# Patient Record
Sex: Female | Born: 1945 | Race: White | Hispanic: No
Health system: Midwestern US, Community
[De-identification: ages and names within clinical notes are randomized; demographics above are authoritative.]

## PROBLEM LIST (undated history)

## (undated) DIAGNOSIS — I10 Essential (primary) hypertension: Secondary | ICD-10-CM

## (undated) DIAGNOSIS — E785 Hyperlipidemia, unspecified: Secondary | ICD-10-CM

## (undated) DIAGNOSIS — I639 Cerebral infarction, unspecified: Secondary | ICD-10-CM

## (undated) HISTORY — DX: Cerebral infarction, unspecified: I63.9

## (undated) HISTORY — DX: Hyperlipidemia, unspecified: E78.5

---

## 1975-03-29 HISTORY — PX: ABDOMINAL HYSTERECTOMY: SHX81

## 1988-03-28 HISTORY — PX: BILATERAL SALPINGOOPHORECTOMY: SHX1223

## 1997-11-04 ENCOUNTER — Ambulatory Visit (HOSPITAL_COMMUNITY): Admission: RE | Admit: 1997-11-04 | Discharge: 1997-11-04 | Payer: Self-pay | Admitting: Internal Medicine

## 1997-12-10 ENCOUNTER — Ambulatory Visit (HOSPITAL_COMMUNITY): Admission: RE | Admit: 1997-12-10 | Discharge: 1997-12-10 | Payer: Self-pay | Admitting: Cardiology

## 1998-01-13 ENCOUNTER — Ambulatory Visit (HOSPITAL_COMMUNITY): Admission: RE | Admit: 1998-01-13 | Discharge: 1998-01-13 | Payer: Self-pay | Admitting: Internal Medicine

## 1998-01-13 ENCOUNTER — Encounter: Payer: Self-pay | Admitting: Internal Medicine

## 1998-04-09 ENCOUNTER — Encounter: Payer: Self-pay | Admitting: Obstetrics and Gynecology

## 1998-04-09 ENCOUNTER — Ambulatory Visit (HOSPITAL_COMMUNITY): Admission: RE | Admit: 1998-04-09 | Discharge: 1998-04-09 | Payer: Self-pay | Admitting: Obstetrics and Gynecology

## 1998-07-09 ENCOUNTER — Ambulatory Visit (HOSPITAL_COMMUNITY): Admission: RE | Admit: 1998-07-09 | Discharge: 1998-07-09 | Payer: Self-pay | Admitting: Obstetrics and Gynecology

## 1998-07-09 ENCOUNTER — Encounter: Payer: Self-pay | Admitting: Obstetrics and Gynecology

## 1998-08-11 ENCOUNTER — Ambulatory Visit (HOSPITAL_COMMUNITY): Admission: RE | Admit: 1998-08-11 | Discharge: 1998-08-11 | Payer: Self-pay | Admitting: Neurosurgery

## 1998-08-11 ENCOUNTER — Encounter: Payer: Self-pay | Admitting: Neurosurgery

## 1998-12-19 ENCOUNTER — Ambulatory Visit (HOSPITAL_COMMUNITY): Admission: RE | Admit: 1998-12-19 | Discharge: 1998-12-19 | Payer: Self-pay | Admitting: Internal Medicine

## 1998-12-19 ENCOUNTER — Encounter: Payer: Self-pay | Admitting: Internal Medicine

## 1999-01-04 ENCOUNTER — Ambulatory Visit (HOSPITAL_COMMUNITY): Admission: RE | Admit: 1999-01-04 | Discharge: 1999-01-04 | Payer: Self-pay | Admitting: Internal Medicine

## 1999-01-04 ENCOUNTER — Encounter: Payer: Self-pay | Admitting: Obstetrics and Gynecology

## 1999-08-23 ENCOUNTER — Encounter: Payer: Self-pay | Admitting: Family Medicine

## 1999-08-23 ENCOUNTER — Encounter: Admission: RE | Admit: 1999-08-23 | Discharge: 1999-08-23 | Payer: Self-pay | Admitting: Family Medicine

## 1999-09-13 ENCOUNTER — Encounter (INDEPENDENT_AMBULATORY_CARE_PROVIDER_SITE_OTHER): Payer: Self-pay | Admitting: *Deleted

## 1999-09-13 ENCOUNTER — Ambulatory Visit (HOSPITAL_COMMUNITY): Admission: RE | Admit: 1999-09-13 | Discharge: 1999-09-13 | Payer: Self-pay | Admitting: Internal Medicine

## 1999-09-14 ENCOUNTER — Ambulatory Visit (HOSPITAL_COMMUNITY): Admission: RE | Admit: 1999-09-14 | Discharge: 1999-09-14 | Payer: Self-pay | Admitting: Internal Medicine

## 1999-09-14 ENCOUNTER — Encounter: Payer: Self-pay | Admitting: Internal Medicine

## 1999-10-27 ENCOUNTER — Emergency Department (HOSPITAL_COMMUNITY): Admission: EM | Admit: 1999-10-27 | Discharge: 1999-10-27 | Payer: Self-pay | Admitting: Emergency Medicine

## 1999-11-17 ENCOUNTER — Encounter (INDEPENDENT_AMBULATORY_CARE_PROVIDER_SITE_OTHER): Payer: Self-pay | Admitting: Specialist

## 1999-11-17 ENCOUNTER — Inpatient Hospital Stay (HOSPITAL_COMMUNITY): Admission: RE | Admit: 1999-11-17 | Discharge: 1999-11-26 | Payer: Self-pay | Admitting: Surgery

## 1999-11-27 ENCOUNTER — Encounter: Payer: Self-pay | Admitting: General Surgery

## 1999-11-27 ENCOUNTER — Encounter: Payer: Self-pay | Admitting: Emergency Medicine

## 1999-11-27 ENCOUNTER — Inpatient Hospital Stay (HOSPITAL_COMMUNITY): Admission: EM | Admit: 1999-11-27 | Discharge: 1999-12-08 | Payer: Self-pay | Admitting: Emergency Medicine

## 1999-11-27 HISTORY — PX: APPENDECTOMY: SHX54

## 1999-11-29 ENCOUNTER — Encounter: Payer: Self-pay | Admitting: General Surgery

## 1999-12-01 ENCOUNTER — Encounter: Payer: Self-pay | Admitting: Surgery

## 1999-12-03 ENCOUNTER — Encounter: Payer: Self-pay | Admitting: Surgery

## 2000-08-18 ENCOUNTER — Inpatient Hospital Stay (HOSPITAL_COMMUNITY): Admission: EM | Admit: 2000-08-18 | Discharge: 2000-08-21 | Payer: Self-pay | Admitting: Internal Medicine

## 2000-08-18 ENCOUNTER — Encounter: Payer: Self-pay | Admitting: Internal Medicine

## 2004-01-07 ENCOUNTER — Inpatient Hospital Stay (HOSPITAL_COMMUNITY): Admission: EM | Admit: 2004-01-07 | Discharge: 2004-01-10 | Payer: Self-pay

## 2004-01-08 ENCOUNTER — Encounter (INDEPENDENT_AMBULATORY_CARE_PROVIDER_SITE_OTHER): Payer: Self-pay | Admitting: *Deleted

## 2004-01-23 ENCOUNTER — Encounter: Admission: RE | Admit: 2004-01-23 | Discharge: 2004-04-22 | Payer: Self-pay | Admitting: Pediatrics

## 2004-05-04 ENCOUNTER — Emergency Department: Payer: Self-pay | Admitting: Emergency Medicine

## 2004-05-16 ENCOUNTER — Emergency Department: Payer: Self-pay | Admitting: Emergency Medicine

## 2004-05-19 ENCOUNTER — Emergency Department: Payer: Self-pay | Admitting: Internal Medicine

## 2004-08-18 ENCOUNTER — Emergency Department (HOSPITAL_COMMUNITY): Admission: EM | Admit: 2004-08-18 | Discharge: 2004-08-18 | Payer: Self-pay | Admitting: Emergency Medicine

## 2005-03-29 ENCOUNTER — Ambulatory Visit (HOSPITAL_COMMUNITY): Admission: RE | Admit: 2005-03-29 | Discharge: 2005-03-29 | Payer: Self-pay | Admitting: Internal Medicine

## 2005-04-25 ENCOUNTER — Inpatient Hospital Stay (HOSPITAL_COMMUNITY): Admission: EM | Admit: 2005-04-25 | Discharge: 2005-05-04 | Payer: Self-pay | Admitting: Emergency Medicine

## 2005-04-25 ENCOUNTER — Ambulatory Visit: Payer: Self-pay | Admitting: Cardiology

## 2005-04-26 ENCOUNTER — Encounter: Payer: Self-pay | Admitting: Cardiology

## 2005-05-12 ENCOUNTER — Ambulatory Visit: Payer: Self-pay | Admitting: Cardiology

## 2005-05-19 ENCOUNTER — Ambulatory Visit: Payer: Self-pay | Admitting: Cardiology

## 2005-05-23 ENCOUNTER — Ambulatory Visit: Payer: Self-pay

## 2005-05-31 ENCOUNTER — Ambulatory Visit: Payer: Self-pay | Admitting: Internal Medicine

## 2005-07-21 ENCOUNTER — Ambulatory Visit (HOSPITAL_COMMUNITY): Admission: RE | Admit: 2005-07-21 | Discharge: 2005-07-22 | Payer: Self-pay | Admitting: Otolaryngology

## 2005-07-27 ENCOUNTER — Encounter: Payer: Self-pay | Admitting: Internal Medicine

## 2005-09-05 ENCOUNTER — Ambulatory Visit: Payer: Self-pay | Admitting: Internal Medicine

## 2005-12-13 ENCOUNTER — Ambulatory Visit: Payer: Self-pay | Admitting: Internal Medicine

## 2006-06-01 ENCOUNTER — Ambulatory Visit: Payer: Self-pay

## 2006-09-01 ENCOUNTER — Ambulatory Visit: Payer: Self-pay | Admitting: Internal Medicine

## 2006-09-19 ENCOUNTER — Ambulatory Visit: Payer: Self-pay | Admitting: Internal Medicine

## 2006-09-19 LAB — CONVERTED CEMR LAB
BUN: 23 mg/dL (ref 6–23)
Basophils Absolute: 0 10*3/uL (ref 0.0–0.1)
Calcium: 9.8 mg/dL (ref 8.4–10.5)
Creatinine, Ser: 1 mg/dL (ref 0.4–1.2)
Eosinophils Relative: 5.1 % — ABNORMAL HIGH (ref 0.0–5.0)
Glucose, Bld: 137 mg/dL — ABNORMAL HIGH (ref 70–99)
HCT: 33.8 % — ABNORMAL LOW (ref 36.0–46.0)
Hemoglobin: 11.9 g/dL — ABNORMAL LOW (ref 12.0–15.0)
INR: 0.9 (ref 0.9–2.0)
Neutrophils Relative %: 52.4 % (ref 43.0–77.0)
Prothrombin Time: 11.7 s (ref 10.0–14.0)
RBC: 3.81 M/uL — ABNORMAL LOW (ref 3.87–5.11)
Sodium: 139 meq/L (ref 135–145)
WBC: 4.4 10*3/uL — ABNORMAL LOW (ref 4.5–10.5)
aPTT: 23.3 s — ABNORMAL LOW (ref 26.5–36.5)

## 2006-09-22 ENCOUNTER — Ambulatory Visit: Payer: Self-pay | Admitting: Internal Medicine

## 2006-09-22 ENCOUNTER — Ambulatory Visit (HOSPITAL_COMMUNITY): Admission: RE | Admit: 2006-09-22 | Discharge: 2006-09-22 | Payer: Self-pay | Admitting: Internal Medicine

## 2006-10-03 ENCOUNTER — Ambulatory Visit: Payer: Self-pay

## 2006-11-08 ENCOUNTER — Other Ambulatory Visit: Admission: RE | Admit: 2006-11-08 | Discharge: 2006-11-08 | Payer: Self-pay | Admitting: Obstetrics and Gynecology

## 2007-01-09 ENCOUNTER — Ambulatory Visit: Payer: Self-pay | Admitting: Internal Medicine

## 2010-06-17 ENCOUNTER — Other Ambulatory Visit: Payer: Self-pay | Admitting: Internal Medicine

## 2010-06-17 DIAGNOSIS — G4459 Other complicated headache syndrome: Secondary | ICD-10-CM

## 2010-06-17 DIAGNOSIS — G459 Transient cerebral ischemic attack, unspecified: Secondary | ICD-10-CM

## 2010-06-21 ENCOUNTER — Other Ambulatory Visit: Payer: Self-pay | Admitting: Internal Medicine

## 2010-06-21 ENCOUNTER — Ambulatory Visit
Admission: RE | Admit: 2010-06-21 | Discharge: 2010-06-21 | Disposition: A | Discharge status: Home / Self Care | Payer: Medicare Other | Source: Ambulatory Visit | Attending: Internal Medicine | Admitting: Internal Medicine

## 2010-06-21 DIAGNOSIS — G4459 Other complicated headache syndrome: Secondary | ICD-10-CM

## 2010-06-21 DIAGNOSIS — Z823 Family history of stroke: Secondary | ICD-10-CM

## 2010-06-21 DIAGNOSIS — I671 Cerebral aneurysm, nonruptured: Secondary | ICD-10-CM

## 2010-06-21 DIAGNOSIS — I729 Aneurysm of unspecified site: Secondary | ICD-10-CM

## 2010-06-21 MED ORDER — GADOBENATE DIMEGLUMINE 529 MG/ML IV SOLN
11.0000 mL | Freq: Once | INTRAVENOUS | Status: AC | PRN
  Administered 2010-06-21: 11 mL via INTRAVENOUS

## 2010-08-10 NOTE — Discharge Summary (Signed)
NAMENETTYE, FLEGAL                    ACCOUNT NO.:  [id number]   MEDICAL RECORD NO.:  [id number]          PATIENT TYPE:  OIB   LOCATION:  2899                         FACILITY:  [facility]   PHYSICIAN:  Doylene Canning. Ladona Ridgel, MD    DATE OF BIRTH:  04-20-1945   DATE OF ADMISSION:  09/22/2006  DATE OF DISCHARGE:  09/22/2006                               DISCHARGE SUMMARY   ALLERGIES:  1. MORPHINE SULFATE.  2. CODEINE.  3. SULFA.  4. ULTRAM.  5. STATINS.   TIME FOR THIS DICTATION:  Greater than 25 minutes.   FINAL DIAGNOSIS:  Syncope of unclear etiology.   SECONDARY DIAGNOSES:  1. Loop recorder implanted, which is now at end of life and needs      explantation.  2. Hypertension.  3. One burst of nonsustained supraventricular tachycardia recorded on      loop recorder.  4. Leg cramps  5. Chronic hypokalemia.   PROCEDURE:  On September 22, 2006. explantation of [employer] loop recorder.  The patient tolerated the procedure well.  Discharging the same day.   She goes home on her regular medications, which are:  1. Klor-Con 20 mEq 2 tablets in the morning, 2 tablets in the evening.  2. Benicar/HCT 40/25 daily.  Dr. Ladona Ridgel recommended that she change to      Benicar 40 mg daily without the hydrochlorothiazide.  This does not      seem to be done yet.  3. Metoprolol 25 mg daily.  4. Norvasc 5 mg daily.  5. Enteric-coated aspirin 81 mg daily.  6. Lexapro 10 mg daily.  7. Prilosec 25 mg daily.   She has followup for a stress test October 02, 2006.  She is asked to keep  that, and also we are asking that her incision be checked at that time  as well.   LABORATORY STUDIES PERTINENT TO THIS ADMISSION:  Taken September 19, 2006,  white cells 4.4, hemoglobin 11.9, hematocrit 33.8 platelets of 300.  Pro  time is 11.7, INR 0.9.  Sodium 139, potassium is 3, chloride 105,  bicarbonate 26, glucose 137, BUN is 23, creatinine 1.  Her Klor-Con was  increased to 40 mg twice daily with finding of potassium of  3.      Maple Mirza, PA      Doylene Canning. Ladona Ridgel, MD  Electronically Signed    GM/MEDQ  D:  09/22/2006  T:  09/23/2006  Job:  [id number]   cc:   Candyce Churn, M.D.

## 2010-08-10 NOTE — Assessment & Plan Note (Signed)
[facility]                         ELECTROPHYSIOLOGY OFFICE NOTE   NAME:[name], Patty SCHERGER                           MRN:          [id number]  DATE:09/01/2006                            DOB:          04/13/1945    [name] returns today for followup.  She is a very pleasant middle-aged  woman with unexplained syncope who underwent insertion of implantable  loop recorder several years ago.  Her device has now reached end-of-life  and is no longer communicative with the interrogator.  She also notes  some chest discomfort which is not related to exertion.   EXAM:  Today, she is a pleasant, well-appearing, middle-aged woman in no  acute distress.  The blood pressure was 173/80, the pulse was 77 and  regular, respirations were 18, the weight was 126 pounds.  NECK:  No jugular venous distention.  LUNGS:  Clear bilaterally to auscultation.  No wheezes, rales or rhonchi  were present.  CARDIOVASCULAR EXAM:  A regular rate and rhythm with normal S1, S2.  EXTREMITIES:  No edema.   Interrogation of her loop recorder demonstrates no availability in  interrogation secondary to the device being at end of life.   IMPRESSION:  1. Syncope of unexplained etiology.  2. Status post loop recorder, now at end of life.  3. Atypical chest pain.   DISCUSSION:  I have recommended that we remove the patient's loop  recorder and undergo exercise treadmill testing to better evaluate her  chest pain for ischemic etiology.  This will be scheduled in the next  couple of weeks.     Doylene Canning. Ladona Ridgel, MD  Electronically Signed    GWT/MedQ  DD: 09/01/2006  DT: 09/01/2006  Job #: [id number]   cc:   Candyce Churn, M.D.

## 2010-08-10 NOTE — Op Note (Signed)
[name], [name]                    ACCOUNT NO.:  [id number]   MEDICAL RECORD NO.:  [id number]          PATIENT TYPE:  OIB   LOCATION:  2899                         FACILITY:  [facility]   PHYSICIAN:  Doylene Canning. Ladona Ridgel, MD    DATE OF BIRTH:  [birth year]   DATE OF PROCEDURE:  09/22/2006  DATE OF DISCHARGE:                               OPERATIVE REPORT   PROCEDURE PERFORMED:  Removal of a previously implantable loop recorder.   INDICATIONS:  Implantable loop recorder was inserted for syncope,  unexplained, and is now at [facility].   INTRODUCTION:  The patient is a very pleasant middle aged woman with a  history of unexplained syncope who has preserved LV function and no  obstructive coronary disease.  She is now referred for removal of her  implantable loop recorder as it has now come to [facility].   PROCEDURE:  After informed consent was obtained, the patient was taken  to the diagnostic catheterization lab in a fasting state.  After the  usual preparation and draping, intravenous fentanyl and midazolam was  given for sedation.  30 mL of lidocaine was infiltrated in the left  infraclavicular region.  A 3 cm incision was carried out over this  region.  Electrocautery was utilized to dissect down to the implantable  loop recorder.  It was subsequently removed with gentle traction.  The  sutures were also removed.  The pocket was then irrigated with  kanamycin.  Electrocautery was utilized to assure hemostasis and the  incision was closed with a layer 2-0 Vicryl followed by a layer of 4-0  Vicryl.  Benzoin was painted on the skin, Steri-Strips were applied, a  pressure dressing was placed, and the patient was returned to her room  in satisfactory condition.   COMPLICATIONS:  There were no immediate procedure complications.   RESULTS:  This demonstrate successful removal of an implantable loop  recorder in a patient with a prior history of unexplained syncope.      Doylene Canning. Ladona Ridgel, MD  Electronically Signed     GWT/MEDQ  D:  09/22/2006  T:  09/23/2006  Job:  [id number]   cc:   Candyce Churn, M.D.

## 2010-08-10 NOTE — Assessment & Plan Note (Signed)
[facility]                         ELECTROPHYSIOLOGY OFFICE NOTE   NAME:Vanvalkenburgh, CARLEA BADOUR                           MRN:          [id number]  DATE:01/09/2007                            DOB:          [birth year]    Ms. Recktenwald returns today for followup.  She is a very pleasant middle-aged  woman with a history of unexplained syncope, who underwent loop recorder  insertion several years ago.  She returns today for followup.  She also  continued to have atypical chest pain.  She underwent stress perfusion  imaging several months ago, which was negative for ischemia.  She  returns today for followup. She notes that her blood pressure has been  reasonably well controlled.   MEDICATIONS:  1. Aspirin 81 mg a day.  2. Lexapro.  3. Benicar/hydrochlorothiazide.  4. Potassium.  5. Prilosec 20 mg a day.  6. Amlodipine 5 mg a day.  7. Metoprolol 25 mg daily.  8. Multivitamins.   PHYSICAL EXAMINATION:  She is a pleasant well-appearing middle-aged  woman in no acute distress.  Blood pressure was 130/76, the pulse was 61 and regular, the  respirations were 18 and the weight was 128 pounds.  NECK:  Revealed no jugulovenous distention.  LUNGS:  Clear bilaterally to auscultation; no wheezes, rales or rhonchi  were present.  CARDIOVASCULAR:  Exam revealed a regular rate and rhythm with a normal  S1 and S2.  There were no murmurs, rubs, or gallops.  EXTREMITIES:  Demonstrated no cyanosis, clubbing or edema.  Pulses were  2+ and symmetric.   EKG:  Demonstrated sinus rhythm with nonspecific ST-T-wave abnormality,  unchanged significant from her previous ECG.   IMPRESSION:  1. Remote history of syncope, status post loop recorder with no      arrhythmias.  2. Atypical chest pain with low-risk stress Myoview study several      months ago.  3. Hypertension, well-controlled.   DISCUSSION:  Ms. Dolloff's symptoms are fairly quiescent at the present  time.  I have asked that  she continue a period of watchful waiting and I  will plan to see the patient back in the office on a p.r.n. basis.     Doylene Canning. Ladona Ridgel, MD  Electronically Signed    GWT/MedQ  DD: 01/09/2007  DT: 01/10/2007  Job #: [id number]   cc:   Candyce Churn, M.D.

## 2010-08-13 NOTE — H&P (Signed)
[facility]  Patient:    [name], [name]                           MRN: [id number] Adm. Date:  [id number] Attending:  Andre Lefort CC:         Sandria Bales. Ezzard Standing, M.D.   History and Physical  IDENTIFICATION AND CHIEF COMPLAINT:  The patient is a 65 year old female with the recurrence of small-bowel obstruction.  HISTORY OF PRESENT ILLNESS:  The patient was recently released from the hospital after an exploratory laparotomy and a lysis of adhesions by Dr. Ovidio Kin.  This procedure was done because of abdominal pain and discomfort and difficulty eating related to previous operations that the patient had had for hysterectomy and a partial hysterectomy.  Her past medical history very unremarkable and had been doing well with the exception of this discomfort once she got home after this procedure.  She called me earlier in the day with multiple episodes of nausea and vomiting which she felt was related to pain medication administration.  I prescribed her some Phenergan suppositories and she, subsequently, had those filled and used them with no relief in her nausea or vomiting, and she presented to the emergency room with severe nausea and vomiting.  PAST MEDICAL HISTORY:  Pretty unremarkable, except for some GERD.  MEDICATIONS:  Pravachol and atenolol for hypertension.  PAST SURGICAL HISTORY:  None, outside of what was mentioned previously including the hysterectomy and partial hysterectomy, and she has had a gallbladder and cholecystectomy and the enterolysis done recently.  REVIEW OF SYSTEMS:  The patient had a bowel movement which was not normal the day of admission, just a smear of soft stool.  She has had no fever or chills. Urinalysis is normal.  PHYSICAL EXAMINATION:  VITAL SIGNS:  Afebrile, with blood pressure 140/80, other vital signs stable.  HEENT:  She is normocephalic, atraumatic, and nonicteric.  NECK:  Supple.  CHEST:   Clear.  CARDIAC:  Regular rhythm and rate with no murmurs, gallops, rubs, or heaves.  ABDOMEN:  Distended, especially in the lower portion, with hypoactive bowel sounds.  She has mild tenderness, no rebound or guarding.  RECTAL:  Unremarkable.  PELVIC:  Deferred.  LABORATORY DATA:  Slightly elevated white count at 12,000, with a mild left shift.  Hematocrit is 37-38%.  Three-way abdomen demonstrates positive gas in the colon and almost a closed loop of small bowel near the left upper to midportion of the abdomen.  A CT scan was not done.  IMPRESSION:  Partial small-bowel obstruction.  PLAN:  Pass an NG tube for decompression, IV hydration, no antibiotics. DD:  11/28/99 TD:  11/28/99 Job: [id number]

## 2010-08-13 NOTE — Assessment & Plan Note (Signed)
[facility]                           ELECTROPHYSIOLOGY OFFICE NOTE   NAME:Decelles, [name]                           MRN:          [id number]  DATE:12/13/2005                            DOB:          10/04/1945    HISTORY:  [name] returns today for follow up.  She is a very pleasant  middle-aged woman with a history of hypertension and syncope who returns for  follow up as noted.  She is status post implantable loop recorder several  months ago.  She has palpitations, which are rare, but denies chest pain.  She does complain of leg cramps, which occur at nighttime and awaken her  from sleep.  She has a history of hypokalemia in the past and is on  potassium supplements; however, she is on, because of her hypertension,  Benicar/hydrochlorothiazide; I wonder if this is related to her leg cramps.   PHYSICAL EXAMINATION:  GENERAL APPEARANCE:  On exam she is a pleasant, well-  appearing woman in no distress.  VITAL SIGNS:  Blood pressure today is 173/90.  The pulse is  62 and regular.  Respirations are 18.  The weight is 128 pounds.  NECK:  The neck reveals no jugular venous distention.  LUNGS:  The lungs are clear bilaterally to auscultation.  HEART:  Cardiovascular exam reveals a regular rate and rhythm with normal S1  and S2.  EXTREMITIES:  The extremities demonstrate no edema.   IMPLANTABLE LOOP RECORDER INTERROGATION DATA:  Interrogation of her  implantable loop recorder demonstrates one burst of nonsustained SVT lasting  for approximately four seconds.  There were no bradycardic episodes.  There  were no other tachycardiac episodes.   IMPRESSION:  1. Syncope (unexplained).  2. Status post loop recorder implantation.  3. Hypertension.  4. Leg cramps.   DISCUSSION:  Today I have asked [name] to switch from  Benicar/hydrochlorothiazide to plain Benicar to see if this helps with her  leg cramps from her hypokalemia.  She will continue on her  potassium  supplements.   We will plan to see her back in the office in six months for interrogation  of her loop recorder or sooner should she have additional syncope or other  significant problems.                                   Doylene Canning. Ladona Ridgel, MD   GWT/MedQ  DD:  12/13/2005 DT:  12/14/2005 Job #:  [id number]   cc:   Candyce Churn, M.D.

## 2010-08-13 NOTE — Op Note (Signed)
. [facility]  Patient:    KRISTIANNA, SAPERSTEIN                           MRN: [id number] Proc. Date: 11/17/99 Adm. Date:  [id number] Attending:  Andre Lefort CC:         Hedwig Morton. Juanda Chance, M.D. [facility]  Arta Silence, M.D. - [facility].   Operative Report  DATE OF BIRTH:  06/28/1945  PREOPERATIVE DIAGNOSES:  Chronic right lower quadrant abdominal pain with symptoms of a partial small-bowel obstruction or chronic small-bowel obstruction.  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  PROCEDURE:  Laparoscopic converted to open abdominal exploration and enterolysis of adhesions, repair of single small bowel enterotomy, appendectomy.  SURGEON:  Ovidio Kin, M.D.  FIRST ASSISTANT:  Catalina Lunger, M.D.  ANESTHESIA:  General endotracheal anesthesia.  ESTIMATED BLOOD LOSS:  450 cc.  DRAINS LEFT IN:  None.  INDICATIONS FOR PROCEDURE:  Ms. Fretz is a 65 year old white female who has been plagued with chronic lower pain particularly in the right lower quadrant since a bilateral oophorectomy performed in [place] in 1998.  She has seen Lina Sar, and is under extensive workup including a CAT scan, small bowel follow through series, colonoscopy which were basically diagnostic except for some maybe mild dilatation of her small bowel.  Discussion was carried out about the options for treatment including continue observation, versus laparoscopy, versus open surgery.  The patient really says the pain is intolerable and needs "something done".  Our plan is to laparoscope the patient if possible, do as much enterolysis as possible, and if I can not, and if this does not work than she will need open surgery.  OPERATIVE NOTE:  The patient completed mechanical and antibiotic bowel prep the day before.  She was given 3 grams of cefotetan at the initiation of the procedure.  Had PS  stockings in place and an NG tube was placed.  An infraumbilical incision was made with sharp dissection and carried down into the abdominal cavity.  A 0 degree laparoscope was passed through a 10 mm Hasson trocar and secured with a 0 Vicryl suture.  The 10 mm scope was not working, so I got a 30 degree scope to look into the abdomen and used a 5 mm scope.  I put two additional trocars which were 5 mm ports in both upper quadrants.  I was able to visualize some midline adhesions.  She actually had some fairly dense adhesions in the right lower quadrant which looked almost inflammatory and on taking these down they bled easily.  She had some nodularity to them and may explain, at least in part, some of her symptoms of pain and discomfort.  In trying to resect down the adhesions with the laparoscope I created a a small enterotomy in the small bowel.  It was clear that we could not repair this laparoscopically and she would be best off if we just went in and opened it. I therefore, took out my trocars, made an open lower midline abdominal incision into the abdominal cavity.  I spent a good hour to an hour and half freeing up adhesions which went from the ______ and involved her distal 1/2 of her small bowel, stuck both to the sigmoid colon on the left side, the pelvis, and the vaginal cuff.  I closed the enterotomy with a 3-0 silk suture.  I took down the adhesions, a couple of little serosal tears, I oversewed with 3-0 silk suture.  I finally got the entire loop of bowel up. It looked viable.  There was no narrowing at any one point that was noted.  I ______ sigmoid of ______ just along the left lateral side wall.  I did have her appendix remaining since it was still stuck in and I wanted to take this out at this time since I thought it would be in her best interest. The body of the mesentery repaired with 2-0 silk suture, the base of the appendix with 0 chromic and 3 silk inverting sutures  to close the appendiceal orifice.  I then irrigated the abdomen with 3 liters of saline, replaced the omentum in a proper position on the anterior abdominal wall to hopefully protect it from any other further adhesions or problems.  I tried to place omentum down the pelvis to keep it from sticking down there and closed the abdomen with 2 pieces of running, 1 PDS suture, with a couple of interrupted PDS sutures in the middle of the incision for security.  Sponge and needle count were correct at the end of the case and the patient was transferred to the recovery room in good condition.  Sponge and needle correct correct.  The only pathology would be the appendix which was removed. DD:  11/17/99 TD:  11/18/99 Job: [id number]

## 2010-08-13 NOTE — Op Note (Signed)
[place]  Patient:    [name], [name]                           MRN: [id number] Proc. Date: 09/13/99 Adm. Date:  [id number] Disc. Date: [id number] Attending:  Mervin Hack CC:         Laurita Quint, M.D.                           Operative Report  PROCEDURE:  Colonoscopy.  INDICATIONS:  This 65 year old white female has had chronic right lower quadrant abdominal pain with recent exacerbation.  The pain has been so incapacitating that she has not been able to walk.  The CT scan of the abdomen and pelvis was unremarkable, as well as pelvic ultrasound.  She has a history of pelvic surgery several years ago which resulted in postoperative complications, and subsequent admission for suspected small bowel obstruction. At that time her terminal ileum showed narrowing consistent with Crohns disease.  Repeat small bowel follow-through later on showed resolution of the radiographic abnormality.  Because of this abnormal x-ray and the recurrence of the abdominal pain, the patient is now undergoing a repeat colonoscopy to assess her right colon as well as terminal ileum.  This is done to rule out any inflammatory changes that could be causing her abdominal pain postoperatively.  ENDOSCOPE:  Fujinon single-channel video endoscope.  SEDATION:  Versed 10 mg IV, Demerol 100 mg IV.  FINDINGS:  Fujinon single-channel video endoscope was passed under direct vision through the rectum into the sigmoid colon.  The patient was monitored by pulse oximetry; oxygen saturations were 87-98% on four liters of nasal oxygen.  The anal canal was found to be unremarkable.  The sigmoid colon showed multiple diverticuli, but normal-sized lumen of the sigmoid colon with essentially normal mucosal folds.  The descending colon, splenic flexure and transverse colon were traversed without difficulty.  There were no extra turns or redundancy of the colon.  Hepatic flexure  and ascending colon was also normal.  Cecal valve was reached without difficulty and showed appendiceal opening.  Cecal valve was clean and free of stool.  The ileocecal valve appeared normal.  The terminal ileum was entered without difficulty and was examined over the length of about 15 cm.  The mucosa appeared normal, there were no accessed ulcers, no abnormal narrowing or scar tissue.  Random biopsies were taken from the terminal ileum.  The colonoscope was then slowly retracted.  The colon decompressed.  The patient tolerated the procedure well.  IMPRESSION: 1. Mild diverticulosis of the left colon. 2. Status post biopsies of the terminal ileum; no evidence of Crohns disease.  PLAN:  The patients abdominal pain remains unexplained.  At this point we do not have any objective abnormalities to explain her abdominal pain.  Because of the pelvic surgery, which had started with pain, and the possibility that she may have adhesions that are not showing on any of radiographic studies, I feel that surgical consultation may be appropriate at the time to consider laparoscopic examination of her right lower quadrant. DD:  09/13/99 TD:  09/15/99 Job: [id number]
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
## 2010-08-13 NOTE — Consult Note (Signed)
NAME:  [name], [name]                    ACCOUNT NO.:  [id number]   MEDICAL RECORD NO.:  [id number]          PATIENT TYPE:  INP   LOCATION:  3020                         FACILITY:  [facility]   PHYSICIAN:  Dyke Brackett, M.D.    DATE OF BIRTH:  [birth year]   DATE OF CONSULTATION:  DATE OF DISCHARGE:                                   CONSULTATION   ADDENDUM:   CLINICAL IMPRESSION:  1.  Multiple contusions to the right upper extremity.  2.  Possible bony Bankart evulsion injury from the inferior glenoid rim.  3.  Possible rotator cuff tear.   RECOMMENDATIONS:  At this time this may just represent an old injury or  possibly that of an loose body. At this time will consider just the use of a  sling for comfort. Will follow her back up in Dr. Candise Bowens office next week  for re-evaluation. At that time we can decide whether MRI scanning is  indicated. If she has any problems in the meantime we will see her at an  earlier date.   Thank you very much for this interesting consultation.      Oris Drone Santiago Bumpers, P.A.-C.      Dyke Brackett, M.D.  Electronically Signed    BDP/MEDQ  D:  04/26/2005  T:  04/26/2005  Job:  [id number]

## 2010-08-13 NOTE — Discharge Summary (Signed)
[name], [name]                    ACCOUNT NO.:  [id number]   MEDICAL RECORD NO.:  [id number]          PATIENT TYPE:  INP   LOCATION:  3019                         FACILITY:  [facility]   PHYSICIAN:  Deanna Artis. Hickling, M.D.DATE OF BIRTH:  [birth year]   DATE OF ADMISSION:  01/07/2004  DATE OF DISCHARGE:  01/10/2004                                 DISCHARGE SUMMARY   FINAL DIAGNOSES:  1.  Right brain acute cerebrovascular accident, nonhemorrhagic, thrombotic,      434.01.  2.  Hypertension, 404.10.  3.  Dyslipidemia.   HISTORY OF PRESENT ILLNESS:  The patient is a 65 year old woman who presents  with left-sided weakness that has waxed and waned.  She was noted to have a  CT scan that showed remote lacunar infarctions at the left basal ganglia and  the right anterior limb of the internal capsule. MRI scan, however, showed a  2 cm acute infarction of the right corona radiata extending to the lateral  aspect of the putamen.  MRA was negative except for atherosclerotic changes  in the siphon.  EKG showed a normal sinus rhythm.  A 2-D echocardiogram,  ejection fraction of 55-65% with no left ventricular regional wall motion  abnormalities, slightly increased left ventricular thickness, abnormal left  ventricular relaxation, trivial aortic and mild mitral regurgitation.  Carotid Dopplers were negative for any hemodynamically significant lesion in  the carotids, and the patient had no evidence of retrograde flow through the  vertebrals.  Speech therapy found no abnormalities.  PT and OT felt that the  patient could benefit from outpatient therapy due to clumsiness, and the  need for further strengthening and improvement of coordination.   PHYSICAL EXAMINATION:  VITAL SIGNS:  On examination today, blood pressure  172/87, resting pulse 115, respirations 18, temperature 97.8.  O2  saturations 92%.  GENERAL:  The patient is awake and alert.  LUNGS:  Clear.  HEART:  No murmurs.  PULSES:   Normal.  ABDOMEN:  Soft, nontender.  Bowel sounds normal.  EXTREMITIES:  No edema.  NEUROLOGIC EXAMINATION:  Awake, alert, no dysphagia.  Cranial nerves: Round,  reactive pupils, normal fundi.  Visual fields full. Symmetric facial  strength. Midline tongue.  Motor examination shows a left hemiparesis that  is mild.  She has good proximal strength. She is very clumsy, fine motor  movements in her hand.  She has some drift with pronation and very slow  movement to bring her finger to her nose.  Some of this appears to be  embellished, but it is difficult to know how much.  The left leg appears to  be normal as does the right side.  Reflexes show a left reflex predominance.  No other focal abnormalities are seen.   DISCHARGE CONDITION:  The patient is discharged in improved condition.   DISCHARGE MEDICATIONS:  1.  Metoprolol 25 mg once daily.  2.  Zocor 40 mg one at 6 p.m.  3.  Hydrochlorothiazide 25 mg one in the morning.  4.  Plavix 75 mg, one in the morning.  5.  Low-fat,  low-salt diet.   DISCHARGE INSTRUCTIONS:  1.  She is not to drive.  2.  She needs to have PT and OT for three weeks as an outpatient.  This will      be set up.  I have contacted the care manager concerning this.       [facility]H/MEDQ  D:  01/10/2004  T:  01/10/2004  Job:  [id number]   cc:   Pramod P. Pearlean Brownie, MD  Fax: [phone]
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
## 2010-08-13 NOTE — Consult Note (Signed)
[name], Page                    ACCOUNT NO.:  [id number]   MEDICAL RECORD NO.:  [id number]          PATIENT TYPE:  EMS   LOCATION:  ED                           FACILITY:  [facility]   PHYSICIAN:  Anselm Pancoast. Weatherly, M.D.DATE OF BIRTH:  [birth year]   DATE OF CONSULTATION:  08/18/2004  DATE OF DISCHARGE:                                   CONSULTATION   CHIEF COMPLAINT:  Left lower abdominal pain.   HISTORY:  [name] is a 65 year old female referred today by Dr. Aida Puffer of [place], [place] for evaluation of left lower abdominal  pain.  Patient gives the following history:  She is on hydrochlorothiazide  and Altace for high blood pressure.  I think about February, 2002, underwent  a laparotomy by Dr. Ezzard Standing for, the patient says, lysis of adhesions, but on  more questioning, it sounded to me that this was possibly diverticulosis,  even though she said no definite bowel resection was performed.  The patient  has not seen Dr. Ezzard Standing but called today, and the nurses recommended that  she see her regular physician, and she saw Dr. Clarene Duke, who she had not seen  previously for this.  He found her definitely tender in the left lower  quadrant, and she said she has not had a bowel movement for approximately  three days, even though she was passing gas and is not distended and has not  been vomiting.  He called and asked that I see her for evaluation, and I  recommended that she come to the emergency room.  She said that her pain was  most intense on Friday and Saturday over the past weekend.  It was kind of  sudden onset of left lower quadrant when it first presented.  Weight is  about 121 pounds.  Has not lost a significant amount of weight.  Works  basically two jobs.  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  She stated that she had a possible problem from her appendix  years ago, but  I do not have any definite details confirming that.  She says  normally her bowels have not been a problem, as far as having to take  chronic laxatives and all, even though she does get some cramping and  bloating in the lower abdomen.   ALLERGIES:  She says she cannot take NARCOTICS.  She is allergic to SULFA.  She is allergic to IVP DYE.   CURRENT MEDICATIONS:  She is on Prilosec.  She is on Altace, Plavix,  hydrochlorothiazide, and metoprolol tartrate.   PHYSICAL EXAMINATION:  VITAL SIGNS:  When she presented to the emergency  room at approximately 5:30, her pulse was 69, respirations 20, blood  pressure 177/90.  She weighed 121 pounds.  GENERAL:  She is a pleasant female who appears her stated age in no acute  distress.  She is kind of laughing and joking, even though Dr. Clarene Duke  described her as  having pretty significant localized left lower quadrant  when she was seen in his office.  She was not given any pain medication  there.  LUNGS:  Good breath sounds bilaterally.  ABDOMEN:  She does have active bowel sounds now.  She is mildly tender in  the left lower quadrant, not really rebound-type tenderness.  Clinically, I  think this would be more likely diverticulitis, mild episode.   A plain abdominal film was first obtained, which was unremarkable.   In her laboratory studies, her white count is 5800.  Her hemoglobin is 12.9  gm% with a hematocrit of 37.3.   I then obtained a CAT scan with oral contrast.  She states that she is  allergic to IVP dye.  The CT shows changes of diverticulosis and possibly a  little short segment of diverticulitis in the left lower quadrant.  There is  contrast going to the rectum at this time, and there is certainly no  evidence of any clinical obstruction.   IMPRESSION:  With the normal white count and afebrile, I think we can treat  her with oral antibiotics, and I would put her on Augmentin 875 b.i.d. and  keep her on kind of a liquid diet  for the next 48 hours and then gradually  advance her diet.  She is going to need a follow-up examination of her colon  in a few weeks, and we will let her see Dr. Ezzard Standing, who has operated on her  previously for this followup, unless she would prefer to see me.  As far as  whether a colonoscopy or a full-column barium enema, if she has no blood in  her stool, which I am not able to detect any at this time, I would usually  get a full-column barium enema, and this clinically appears to be the first  episode of diverticulitis that she has described.  I will try to get the old  records or what Dr. Ezzard Standing had found at the time, about four years ago, when  he operated on her.      WJW/MEDQ  D:  08/18/2004  T:  08/18/2004  Job:  [id number]   cc:   Dr. Aida Puffer  [facility]  [address] [place] [address]

## 2010-08-13 NOTE — Discharge Summary (Signed)
[name]  Patient:    [name], [name]                           MRN: [id number] Adm. Date:  [id number] Disc. Date: [id number] Attending:  Andre Lefort CC:         Laurita Quint, M.D., [facility]  Hedwig Morton. Juanda Chance, M.D. [facility]  Daniel L. Clarke-Pearson, M.D.   Discharge Summary  DATE OF BIRTH:  Mar 28, 1946  DISCHARGE DIAGNOSES: 1. 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 2. 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 3. Left ear ache. 4. Hypertension. 5. Chronic right lower quadrant abdominal pain.  OPERATIONS PERFORMED:  Laparoscopic converted to open enterolysis of adhesions with repair of enterotomy and appendectomy on November 17, 1999.  HISTORY OF PRESENT ILLNESS:  [name] is a 65 year old white female who is a patient of Dr. Laurita Quint and Dr. Lina Sar who has had chronic right lower quadrant abdominal pain.  In the past, she has undergone a ______  in approximately ______ without incident, but then she had bilateral salpingo-oophorectomy by Dr. Greggory Keen in [place] in 1998 and has had postoperative discomfort and pain basically since that time.  She was actually hospitalized in July 1998 for a partial bowel obstruction which resolved spontaneously.  She kind of had vague abdominal discomfort and problems on and off for the last two years, though in the last six months, she has noticed some increase in the discomfort she has had with pain more in the right lower quadrant.  She had undergone a CT scan in May which showed a tubular structure in the left pelvis which was unchanged over the last six months.  She underwent a colonoscopy by Dr. Lina Sar in June 2001 which showed no evidence of Crohns disease or colitis.  Then she underwent a small bowel series at [facility] on September 14, 1999, which showed some mild dilatation of  distal small bowel, no other abnormality.  She was admitted for planned exploration fluoroscopically, possibly open, to relieve possible chronic bowel obstruction secondary to adhesions.  MEDICATIONS:  Prevacid, Estradiol, atenolol, and propoxyphene or Darvocet for pain.  PAST MEDICAL HISTORY:  Otherwise noncontributory.  ADMISSION LABORATORY DATA:  Hemoglobin 13.7, hematocrit 38.1, white blood count 8300.  She had a sodium of 139, potassium 3.67, chloride 107, creatinine 1.0, glucose 118.  HOSPITAL COURSE:  She was taken to the operating room on the day of admission. She had undergone a bowel prep the day before surgery.  She underwent a laparoscopy which I then converted to an open enterolysis of adhesions.  She underwent repair of single enterotomy and also finding her appendix did not look grossly abnormal other than adhesions around it and did an appendectomy on her.  Postoperatively, she did well.  On her first postoperative day, her white blood count was 10,400, hemoglobin 11, hematocrit 33.  Sodium 137, potassium 4.3, chloride 107, glucose 164.  She has a fairly low tolerance to pain.  I left her NG tube in for a good four to five days.  She developed an ear ache secondary to NG tube.  I finally pulled the NG tube and started her on clear liquids which she has tolerated.  She is now 9 days postop.  She is having bowel movements.  She is mildly distended, though her abdominal incision is well  healed.  She still says she hurts a fair amount but is able to tolerate it with oral pain medicine.  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  Dr. Luisa Hart is requesting slides from [place] where her bilateral salpingo-oophorectomy  was obtained.  I have spoken with Dr. Serita Kyle who is going to review the patients history and chart after we get all her pathology  together.  DISCHARGE CONDITION:  She is now ready for discharge.  DIET:  Regular.  ACTIVITY:  She is to do no heavy lifting for four weeks.  FOLLOWUP:  She is to see me back in 10 days or 14 days for review of her pathology and see how she is doing.  She can shower.  I sent her home with [facility] and also said she can use some Advil or ibuprofen for pain control. DD:  11/26/99 TD:  11/27/99 Job: [id number]

## 2010-08-13 NOTE — Discharge Summary (Signed)
[place]  Patient:    [name], [name]                           MRN: [id number] Adm. Date:  [id number] Disc. Date: [id number] Attending:  Andre Lefort CC:         Hedwig Morton. Juanda Chance, M.D. [facility]  Daniel L. Clarke-Pearson, M.D.   Discharge Summary  DISCHARGE DIAGNOSES: 1. Partial small-bowel obstruction which has resolved. 2. Primary peritoneal tumor of indeterminate etiology, final pathology    pending. 3. Chronic abdominal pain. 4. Hypertension.  OPERATION PERFORMED:  None.  HISTORY OF PRESENT ILLNESS:  [name] is a 65 year old white female who underwent a recent enterolysis of adhesions by me.  She had been home only a day or two when she started having an increasing abdominal pain, nausea and vomiting and was readmitted by Jimmye Norman, M.D., for me.  Her past medical history and general history were well outlined in her last admission notes.  PAST MEDICAL HISTORY:  This is most significant for: 1. Hypertension treated with atenolol. 2. Hypercholesterolemia treated with Pravachol. 3. Gastroesophageal reflux disease.  ADMISSION PHYSICAL EXAMINATION:  VITAL SIGNS:  Blood pressure 140/80.  ABDOMEN:  Distended, especially the lower part with hypoactive bowel sounds and she had mild lower abdominal tenderness.  LABORATORY DATA:  Her white blood cell count was 12,000, hematocrit 37.  HOSPITAL COURSE: On admission she had an NG tube placed by Dr. Lindie Spruce and was placed on IV fluids.  Over the next 48 hours she seemed to get a little bit better.  I left her NG tube in and started clamping this after three days in the hospital and on the fifth day we checked her KUB which showed significant improvement in her bowel gas pattern and we removed her NG tube.  On December 03, 1999, her sodium was 134, potassium 4.0, chloride 103, CO2 20, glucose 117.  Problems with small-bowel was resolved, though she was still having a fair amount of abdominal  pain and is taking p.o.s poorly so we decided to keep her a few more days.  She is now in the hospital 11 days.  She is eating much better with less abdominal pain. She has had a bowel movement. Her abdomen is reasonably soft without any evidence of infection and she is ready for discharge.  DISCHARGE MEDICATIONS:  Resume her home medications including antihypertensives.  We will give her some Demerol 50 mg x 20 x a pain medication at home.  DIET:  Eating light.  ACTIVITY:  Her activity will really be kind of unlimited.  FOLLOW-UP:  She is to see me back in one week.  DISCHARGE CONDITION:  Good. DD:  12/08/99 TD:  12/09/99 Job: [id number]

## 2010-08-13 NOTE — Discharge Summary (Signed)
[facility]  Patient:    [name], [name]                           MRN: [id number] Adm. Date:  [id number] Disc. Date: [id number] Attending:  Tresa Garter CC:         Laurita Quint, M.D. [facility]   Discharge Summary  DISCHARGE DIAGNOSES: 1. Nausea, vomiting and diarrhea, presumed gastroenteritis. 2. Hypertension. 3. Question of Crohns disease in the past. 4. Multiple abdominal adhesions. 5. Chronic right lower quadrant abdominal pain. 6. History of renal stone. 7. Status post cholecystectomy. 8. History of total abdominal hysterectomy and bilateral    salpingo-oophorectomy. 9. Status post appendectomy.  PROCEDURES:  None.  CONSULTATIONS:  None.  HISTORY AND PHYSICAL:  Please see the note dictated on May 24 by Dr. Posey Rea.  HOSPITAL COURSE:  The patient is a pleasant 65 year old white female initially admitted after three days with nausea, vomiting, cramping abdominal pain and loose stools, mostly water, complicated by severe dehydration, weakness and found to have potassium 2.3 on admission.  She was markedly orthostatic. S he was treated with IV fluids and potassium replacement, to which she responded nicely.  Phenergan helped the nausea.  She began tolerating ice chips, and diet was advanced with gradual decrease in abdominal discomfort, as well.  By the day prior to discharge, she was asymptomatic except for a few crampy pains, and loose stool thereafter controlled with Lomotil.  She was ambulatory, eating well and afebrile.  Potassium was 3.5.  Stool cultures were negative.  CBC and BMET otherwise within normal limits.  Glucose was slightly elevated on several occasions, 154 and 122 during the hospitalization.  Stool was guaiac negative throughout.  C. difficile toxin obtained and pending.  As she was doing well on the date of discharge, she was felt to be at maximum benefit from this hospitalization, and she was discharged to  home.  DISPOSITION:  Discharged to home in good condition.  No activity restrictions. No dietary restrictions except for 3 g sodium diet as before due to her history of hypertension.  She will follow up with Dr. Hetty Ely in 1-2 weeks.  DISCHARGE MEDICATIONS:  As before admission plus Lomotil on a p.r.n. basis. This includes Atenolol 50 mg p.o. b.i.d.; Prevacid 30 mg b.i.d.; Norvasc 5 mg p.o. q.d.; Estrace 1 mg p.o. q.d. DD:  08/21/00 TD:  08/21/00 Job: [id number]
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
## 2010-08-13 NOTE — Op Note (Signed)
NAMELOUKISHA, GUNNERSON                    ACCOUNT NO.:  [id number]   MEDICAL RECORD NO.:  [id number]          PATIENT TYPE:  OIB   LOCATION:  5731                         FACILITY:  [facility]   PHYSICIAN:  Suzanna Obey, M.D.       DATE OF BIRTH:  [birth year]   DATE OF PROCEDURE:  07/21/2005  DATE OF DISCHARGE:                                 OPERATIVE REPORT   PREOPERATIVE DIAGNOSIS:  Deviated septum and turbinate hypertrophy.   POSTOPERATIVE DIAGNOSIS:  Deviated septum and turbinate hypertrophy.   SURGICAL PROCEDURES:  Septoplasty and submucous section of inferior  turbinates.   ANESTHESIA:  General.   ESTIMATED BLOOD LOSS:  Approximately 10 mL.   INDICATIONS:  This is a 65 year old who has had chronic nasal obstruction  and has failed medical therapy.  She is sick of the obstruction and is ready  to proceed with surgery.  She was informed of the risks and benefits of the  procedure including bleeding, infection, perforation, chronic crusting and  drying, septal perforation, change in the external appearance of the nose,  numbness of the teeth, risks of the anesthetic.  All questions were answered  and consent was obtained.   OPERATION:  The patient was taken to the operating room and placed in a  supine position. After adequate general endotracheal tube anesthesia, was  prepped and draped in the usual sterile manner.  Oxymetazoline pledgets were  placed in the nose bilaterally and the septum and inferior turbinates were  injected with 1% lidocaine with 1:100,000 epinephrine.  A right  hemitransfixion incision was performed raising a mucoperichondrial and  ostial flap.  The cartilage was divided about 2 cm posterior to the caudal  strut and the cartilage was removed with a Therapist, nutritional.  The opposite  flap was elevated and Laren Boom forceps were used to remove the  deviated portion of the bone and cartilage.  The hemitransfixion incision is  closed with interrupted 4-0 chromic and  a 4-0 plain gut was placed to the  septum.  The turbinates were infractured, a midline incision made with a 15  blade, mucosal flap elevated superiorly, the inferior mucosa and bone were  removed with the turbinate scissors.  The edge was cauterized with suction  cautery and both flaps were laid back down and turbinates were outfractured.  Telfa rolls soaked in bacitracin were placed in the nose bilaterally and  secured with a 3-0 nylon.  The patient was awakened and brought to the  recovery stable condition.  Counts correct.           ______________________________  Suzanna Obey, M.D.     JB/MEDQ  D:  07/21/2005  T:  07/21/2005  Job:  [id number]

## 2010-08-13 NOTE — H&P (Signed)
[facility]  Patient:    [name], [name]                           MRN: [id number] Adm. Date:  [id number] Attending:  Tresa Garter CC:         Laurita Quint, M.D. [facility]  Sandria Bales. Ezzard Standing, M.D.  Hedwig Morton. Juanda Chance, M.D. [facility]   History and Physical  DATE OF BIRTH:  07/25/1945  CHIEF COMPLAINT:  Nausea, vomiting, diarrhea.  HISTORY OF PRESENT ILLNESS:  The patient is a 65 year old white female who started fairly acute onset of nausea, vomiting, and diarrhea on Sunday.  On Saturday, she had a steak at [employer].  Three out of nine people in their party got sick with a gastrointestinal illness that lasted 24-48 hours.  This morning, she had some vomiting with bile-looking content.  She was concerned and saw Dr. Hetty Ely, who advised hospitalization.  She was admitted to [facility].  PAST MEDICAL HISTORY: 1. Hypertension. 2. Enterolysis. 3. 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 Ezzard Standing. 4. History of hysterectomy, bilateral salpingo-oophorectomy in 1998. 5. Cholecystectomy. 6. Possible Crohns disease. 7. Multiple problems with adhesions. 8. Chronic right lower quadrant abdominal pain. 9. Kidney stone in 1999.  SOCIAL HISTORY:  She is married with three children.  She does not smoke or drink.  CURRENT MEDICATIONS: 1. Atenolol 50 mg twice a day. 2. Prevacid 30 mg twice a day. 3. Norvasc 5 mg once a day. 4. Estrace 1 mg once a day.  ALLERGIES: 1. HYDROCODONE. 2. CODEINE. 3. OXYCODONE. 4. SULFA. 5. ULTRAM. 6. BIAXIN.  FAMILY HISTORY:  Father died at age 3 with CVA.  REVIEW OF SYSTEMS:  She was doing well after surgery in August 2001.  This was the first episode.  Occasional indigestion.  No chest pain or shortness of breath.  No syncope.  The rest is negative.  PHYSICAL EXAMINATION:  VITAL SIGNS:  Blood pressure supine 142/66, upright 111/71.  Afebrile.  Heart: 80.  GENERAL:  She  is in no acute distress.  She looks tired.  HEENT:  Dry oral mucosa.  NECK:  Supple.  No bruits, no thyromegaly.  LUNGS:  Clear to auscultation and percussion.  CARDIAC:  S1, S2.  No murmur, no gallop.  ABDOMEN:  Soft, slightly distended.  Bowel sounds sluggish.  Tender diffusely. No rebound symptoms.  No masses felt.  RECTAL:  Not done.  EXTREMITIES:  Lower extremity without edema.  NEUROLOGIC:  She is alert, oriented, and cooperative.  Cranial nerves II-XII normal.  Muscle strength normal.  LABORATORY DATA:  Labs not available at this point.  ASSESSMENT AND PLAN: 1. Abdominal pain, nausea, vomiting, dehydration; likely of infectious    etiology.  Obtain stool cultures.  Supportive treatment with IV fluids.  We    will keep n.p.o. for now. 2. Dehydration with orthostatic blood pressure drop.  We will hold blood    pressure medicines for one day. 3. Extensive history of adhesions with chronic abdominal pain.  She was doing    well after surgery by Dr. Ezzard Standing.  Obtain KUB. 4. Hold Estrace while in the hospital. 5. Hypertension.  Plan as above. DD:  08/18/00 TD:  08/19/00 Job: [id number]
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
## 2010-08-13 NOTE — H&P (Signed)
NAME:  [name], [name]                    ACCOUNT NO.:  [id number]   MEDICAL RECORD NO.:  [id number]          PATIENT TYPE:  EMS   LOCATION:  MAJO                         FACILITY:  [facility]   PHYSICIAN:  Michael L. Reynolds, M.D.DATE OF BIRTH:  1945/07/14   DATE OF ADMISSION:  01/07/2004  DATE OF DISCHARGE:                                HISTORY & PHYSICAL   CHIEF COMPLAINT:  Left sided weakness and slurred speech.   HISTORY OF PRESENT ILLNESS:  This is the initial [facility]  admission for this 65 year old woman with a past medical history which  includes untreated hypertension.  The patient awoke this morning feeling  funny.  She noted after waking about 5 a.m. that she had some subjective  difficulty finding and getting words out.  She also noted that she had  trouble tying her shoes and believes this was because her left hand was  clumsy and somewhat weak.  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  She was brought to the emergency room later today by her  family and here according to nursing staff and ED physician has had  fluctuating clarity of speech as well as fluctuating left upper extremity  weakness.  She was initially found to have good strength on examination by  the emergency room physician.  She had an abnormal EKG and low potassium but  had no cardiac symptoms.  She reports that since she has been receiving  potassium through her IV in her left arm, her left arm has been weaker which  also has some heaviness of the left leg which is new.  She had not had any  previous similar symptoms prior to this.   PAST MEDICAL HISTORY:  1.  Hypertension, going back about 20 years.  This has been untreated for      some time due to the fact that she lost her health insurance and has      been unable to afford her medication, although she was recently started      on  hydrochlorothiazide by an occupational medicine doctor about three      weeks ago.  2.  Mitral valve prolapse.  3.  She denies history of diabetes, stroke, or coronary artery disease.   FAMILY HISTORY:  Remarkable for multiple strokes in her father.   SOCIAL HISTORY:  She works as a Neurosurgeon.  She denies tobacco or alcohol  use.   ALLERGIES:  CODEINE.  SULFA.   MEDICATIONS:  Aspirin every day, Prilosec daily, HCTZ uncertain dose.   REVIEW OF SYSTEMS:  Negative across ten systems except as outlined in the  HPI and the emergency room and admission nursing records.   PHYSICAL EXAMINATION:  VITAL SIGNS:  Temperature 97.1, blood pressure  180/98, pulse 99, respirations 20.  GENERAL:  She is alert and in no distress.  This is a healthy-appearing  female supine in a hospital bed in no evident distress.  HEENT:  Cranium is normocephalic and atraumatic.  Oropharynx is benign.  NECK:  Supple without carotid bruits.  HEART:  Regular rate and rhythm without murmurs.  CHEST:  Clear to auscultation bilaterally.  ABDOMEN:  Soft, nontender.  No hepatomegaly.  EXTREMITIES:  Pedal pulses 1+.  No edema.  NEUROLOGIC:  Mental status:  She is awake, alert, fully oriented.  She is  able to follow multi-step commands.  Her speech is mildly to moderately  dysarthric with somewhat fluctuating severity but she does not seem to have  any aphagia.  Mood is euthymic and affect appropriate.  Cranial nerves:  Funduscopic exam reveals hypertensive changes.  Pupils equal and reactive  but there does appear to be a left __________  pupillary defect.  Extraocular movements full without nystagmus.  Visual fields full to  confrontation.  Facial sensation is diminished to pinprick on the left.  There is a left facial droop with mild weakness.  Tongue and palate move  symmetrically.  Motor:  Normal bulk and tone.  There is antigravity strength  in all extremity muscles but there is some definite weakness of the  left  upper extremity and the left hip flexors, although testing of the left upper  extremity is complicated by some giveaway.  Sensation:  She reports  diminished pinprick sensation over the left upper and lower extremities  compared to the right.  Gait is deferred.  Reflexes are symmetric.  Toe was  down on the right and neutral on the left.   LABORATORY REVIEW:  CBC:  White count 7.0, hemoglobin 14.1, platelets  334,000.  C-MET is remarkable only for an elevated glucose of 105 and a low  potassium of 2.5.  Coags are normal.  Cardiac markers are negative.  Urine  drug screen is negative.   EKG reveals a ST depression in the inferolateral leads with a normal sinus  rhythm.   CT of the head reveals a possible subacute infarct in the left deep white  matter near the frontal horn of the left lateral ventricle and a few  scattered lacunes but no clear acute infarct.   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  The primary risk factor is uncontrolled hypertension.   PLAN:  1.  Place on IV heparin due to fluctuating symptoms.  2.  We will conduct a stroke workup including MRI, MRA, carotid and      transcranial Dopplers, echocardiogram, and stroke labs.  3.  Physical, occupational, and speech therapy will assess.  4.  She may need rehab stay.  5.  Stroke service to follow.       MLR/MEDQ  D:  01/07/2004  T:  01/08/2004  Job:  [id number]

## 2010-09-07 ENCOUNTER — Other Ambulatory Visit (HOSPITAL_COMMUNITY): Payer: Self-pay | Admitting: Neurosurgery

## 2010-09-07 DIAGNOSIS — I729 Aneurysm of unspecified site: Secondary | ICD-10-CM

## 2010-09-09 ENCOUNTER — Ambulatory Visit (HOSPITAL_COMMUNITY)
Admission: RE | Admit: 2010-09-09 | Discharge: 2010-09-09 | Disposition: A | Discharge status: Home / Self Care | Payer: Medicare Other | Source: Ambulatory Visit | Attending: Neurosurgery | Admitting: Neurosurgery

## 2010-09-09 ENCOUNTER — Other Ambulatory Visit (HOSPITAL_COMMUNITY): Payer: Self-pay | Admitting: Neurosurgery

## 2010-09-09 DIAGNOSIS — F329 Major depressive disorder, single episode, unspecified: Secondary | ICD-10-CM | POA: Insufficient documentation

## 2010-09-09 DIAGNOSIS — Z8673 Personal history of transient ischemic attack (TIA), and cerebral infarction without residual deficits: Secondary | ICD-10-CM | POA: Insufficient documentation

## 2010-09-09 DIAGNOSIS — M549 Dorsalgia, unspecified: Secondary | ICD-10-CM | POA: Insufficient documentation

## 2010-09-09 DIAGNOSIS — I10 Essential (primary) hypertension: Secondary | ICD-10-CM | POA: Insufficient documentation

## 2010-09-09 DIAGNOSIS — F3289 Other specified depressive episodes: Secondary | ICD-10-CM | POA: Insufficient documentation

## 2010-09-09 DIAGNOSIS — I729 Aneurysm of unspecified site: Secondary | ICD-10-CM

## 2010-09-09 DIAGNOSIS — I059 Rheumatic mitral valve disease, unspecified: Secondary | ICD-10-CM | POA: Insufficient documentation

## 2010-09-09 DIAGNOSIS — R51 Headache: Secondary | ICD-10-CM | POA: Insufficient documentation

## 2010-09-09 DIAGNOSIS — K219 Gastro-esophageal reflux disease without esophagitis: Secondary | ICD-10-CM | POA: Insufficient documentation

## 2010-09-09 DIAGNOSIS — G500 Trigeminal neuralgia: Secondary | ICD-10-CM | POA: Insufficient documentation

## 2010-09-09 DIAGNOSIS — I6529 Occlusion and stenosis of unspecified carotid artery: Secondary | ICD-10-CM | POA: Insufficient documentation

## 2010-09-09 LAB — POCT I-STAT, CHEM 8
BUN: 22 mg/dL (ref 6–23)
Chloride: 102 mEq/L (ref 96–112)
Creatinine, Ser: 1.1 mg/dL (ref 0.4–1.2)
HCT: 38 % (ref 36.0–46.0)
Sodium: 138 mEq/L (ref 135–145)

## 2010-09-09 LAB — CBC
HCT: 36.6 % (ref 36.0–46.0)
MCH: 30.8 pg (ref 26.0–34.0)
MCHC: 35 g/dL (ref 30.0–36.0)
MCV: 88 fL (ref 78.0–100.0)
Platelets: 286 10*3/uL (ref 150–400)

## 2010-09-09 LAB — PROTIME-INR: Prothrombin Time: 12.7 seconds (ref 11.6–15.2)

## 2010-09-09 MED ORDER — IOHEXOL 300 MG/ML  SOLN
300.0000 mg | Freq: Once | INTRAMUSCULAR | Status: AC | PRN
  Administered 2010-09-09: 105 mL via INTRAVENOUS

## 2011-05-17 DIAGNOSIS — G459 Transient cerebral ischemic attack, unspecified: Secondary | ICD-10-CM | POA: Diagnosis not present

## 2011-05-17 DIAGNOSIS — I10 Essential (primary) hypertension: Secondary | ICD-10-CM | POA: Diagnosis not present

## 2011-05-17 DIAGNOSIS — F329 Major depressive disorder, single episode, unspecified: Secondary | ICD-10-CM | POA: Diagnosis not present

## 2011-05-17 DIAGNOSIS — R072 Precordial pain: Secondary | ICD-10-CM | POA: Diagnosis not present

## 2011-08-12 DIAGNOSIS — R05 Cough: Secondary | ICD-10-CM | POA: Diagnosis not present

## 2011-08-12 DIAGNOSIS — J301 Allergic rhinitis due to pollen: Secondary | ICD-10-CM | POA: Diagnosis not present

## 2011-08-12 DIAGNOSIS — I10 Essential (primary) hypertension: Secondary | ICD-10-CM | POA: Diagnosis not present

## 2011-09-09 DIAGNOSIS — J301 Allergic rhinitis due to pollen: Secondary | ICD-10-CM | POA: Diagnosis not present

## 2011-09-09 DIAGNOSIS — Z1331 Encounter for screening for depression: Secondary | ICD-10-CM | POA: Diagnosis not present

## 2011-09-09 DIAGNOSIS — R05 Cough: Secondary | ICD-10-CM | POA: Diagnosis not present

## 2011-09-09 DIAGNOSIS — I10 Essential (primary) hypertension: Secondary | ICD-10-CM | POA: Diagnosis not present

## 2011-10-04 ENCOUNTER — Other Ambulatory Visit: Payer: Self-pay | Admitting: Internal Medicine

## 2011-10-04 ENCOUNTER — Other Ambulatory Visit: Payer: Self-pay | Admitting: Neurosurgery

## 2011-10-04 DIAGNOSIS — I671 Cerebral aneurysm, nonruptured: Secondary | ICD-10-CM

## 2011-10-05 DIAGNOSIS — I671 Cerebral aneurysm, nonruptured: Secondary | ICD-10-CM | POA: Diagnosis not present

## 2011-10-07 ENCOUNTER — Ambulatory Visit
Admission: RE | Admit: 2011-10-07 | Discharge: 2011-10-07 | Disposition: A | Discharge status: Home / Self Care | Payer: Medicare Other | Source: Ambulatory Visit | Attending: Neurosurgery | Admitting: Neurosurgery

## 2011-10-07 DIAGNOSIS — I671 Cerebral aneurysm, nonruptured: Secondary | ICD-10-CM

## 2011-10-07 DIAGNOSIS — I10 Essential (primary) hypertension: Secondary | ICD-10-CM | POA: Diagnosis not present

## 2011-10-07 MED ORDER — IOHEXOL 350 MG/ML SOLN
100.0000 mL | Freq: Once | INTRAVENOUS | Status: AC | PRN
  Administered 2011-10-07: 100 mL via INTRAVENOUS

## 2011-11-15 DIAGNOSIS — I671 Cerebral aneurysm, nonruptured: Secondary | ICD-10-CM | POA: Diagnosis not present

## 2012-01-23 DIAGNOSIS — J069 Acute upper respiratory infection, unspecified: Secondary | ICD-10-CM | POA: Diagnosis not present

## 2012-02-13 DIAGNOSIS — Z23 Encounter for immunization: Secondary | ICD-10-CM | POA: Diagnosis not present

## 2012-02-13 DIAGNOSIS — F329 Major depressive disorder, single episode, unspecified: Secondary | ICD-10-CM | POA: Diagnosis not present

## 2012-02-13 DIAGNOSIS — I10 Essential (primary) hypertension: Secondary | ICD-10-CM | POA: Diagnosis not present

## 2012-02-13 DIAGNOSIS — Z0000 Encounter for general adult medical examination without abnormal findings: Secondary | ICD-10-CM | POA: Diagnosis not present

## 2012-02-13 DIAGNOSIS — J301 Allergic rhinitis due to pollen: Secondary | ICD-10-CM | POA: Diagnosis not present

## 2012-02-13 DIAGNOSIS — Z79899 Other long term (current) drug therapy: Secondary | ICD-10-CM | POA: Diagnosis not present

## 2012-02-13 DIAGNOSIS — Z1231 Encounter for screening mammogram for malignant neoplasm of breast: Secondary | ICD-10-CM | POA: Diagnosis not present

## 2012-02-16 DIAGNOSIS — N6489 Other specified disorders of breast: Secondary | ICD-10-CM | POA: Diagnosis not present

## 2012-03-26 DIAGNOSIS — Z01419 Encounter for gynecological examination (general) (routine) without abnormal findings: Secondary | ICD-10-CM | POA: Diagnosis not present

## 2012-04-30 DIAGNOSIS — M76899 Other specified enthesopathies of unspecified lower limb, excluding foot: Secondary | ICD-10-CM | POA: Diagnosis not present

## 2012-04-30 DIAGNOSIS — D485 Neoplasm of uncertain behavior of skin: Secondary | ICD-10-CM | POA: Diagnosis not present

## 2012-05-14 DIAGNOSIS — N958 Other specified menopausal and perimenopausal disorders: Secondary | ICD-10-CM | POA: Diagnosis not present

## 2012-07-05 ENCOUNTER — Other Ambulatory Visit: Payer: Self-pay | Admitting: Gastroenterology

## 2012-07-05 DIAGNOSIS — Z1211 Encounter for screening for malignant neoplasm of colon: Secondary | ICD-10-CM | POA: Diagnosis not present

## 2012-07-05 DIAGNOSIS — K5730 Diverticulosis of large intestine without perforation or abscess without bleeding: Secondary | ICD-10-CM | POA: Diagnosis not present

## 2012-07-05 DIAGNOSIS — D126 Benign neoplasm of colon, unspecified: Secondary | ICD-10-CM | POA: Diagnosis not present

## 2012-07-31 DIAGNOSIS — R1032 Left lower quadrant pain: Secondary | ICD-10-CM | POA: Diagnosis not present

## 2012-10-10 DIAGNOSIS — H25019 Cortical age-related cataract, unspecified eye: Secondary | ICD-10-CM | POA: Diagnosis not present

## 2013-01-07 ENCOUNTER — Other Ambulatory Visit: Payer: Self-pay | Admitting: Neurosurgery

## 2013-01-07 DIAGNOSIS — I671 Cerebral aneurysm, nonruptured: Secondary | ICD-10-CM

## 2013-01-08 ENCOUNTER — Other Ambulatory Visit: Payer: Self-pay | Admitting: Neurosurgery

## 2013-01-08 DIAGNOSIS — I671 Cerebral aneurysm, nonruptured: Secondary | ICD-10-CM | POA: Diagnosis not present

## 2013-01-08 LAB — BUN: BUN: 20 mg/dL (ref 6–23)

## 2013-01-10 ENCOUNTER — Ambulatory Visit
Admission: RE | Admit: 2013-01-10 | Discharge: 2013-01-10 | Disposition: A | Discharge status: Home / Self Care | Payer: Medicare Other | Source: Ambulatory Visit | Attending: Neurosurgery | Admitting: Neurosurgery

## 2013-01-10 DIAGNOSIS — I671 Cerebral aneurysm, nonruptured: Secondary | ICD-10-CM

## 2013-01-11 ENCOUNTER — Ambulatory Visit
Admission: RE | Admit: 2013-01-11 | Discharge: 2013-01-11 | Disposition: A | Discharge status: Home / Self Care | Payer: PRIVATE HEALTH INSURANCE | Source: Ambulatory Visit | Attending: Neurosurgery | Admitting: Neurosurgery

## 2013-01-11 DIAGNOSIS — I672 Cerebral atherosclerosis: Secondary | ICD-10-CM | POA: Diagnosis not present

## 2013-01-11 MED ORDER — IOHEXOL 350 MG/ML SOLN
100.0000 mL | Freq: Once | INTRAVENOUS | Status: AC | PRN
  Administered 2013-01-11: 100 mL via INTRAVENOUS

## 2013-01-21 DIAGNOSIS — I671 Cerebral aneurysm, nonruptured: Secondary | ICD-10-CM | POA: Diagnosis not present

## 2013-02-05 DIAGNOSIS — Z23 Encounter for immunization: Secondary | ICD-10-CM | POA: Diagnosis not present

## 2013-02-15 DIAGNOSIS — Z1331 Encounter for screening for depression: Secondary | ICD-10-CM | POA: Diagnosis not present

## 2013-02-15 DIAGNOSIS — F329 Major depressive disorder, single episode, unspecified: Secondary | ICD-10-CM | POA: Diagnosis not present

## 2013-02-15 DIAGNOSIS — R748 Abnormal levels of other serum enzymes: Secondary | ICD-10-CM | POA: Diagnosis not present

## 2013-02-15 DIAGNOSIS — J301 Allergic rhinitis due to pollen: Secondary | ICD-10-CM | POA: Diagnosis not present

## 2013-02-15 DIAGNOSIS — H6980 Other specified disorders of Eustachian tube, unspecified ear: Secondary | ICD-10-CM | POA: Diagnosis not present

## 2013-02-15 DIAGNOSIS — J339 Nasal polyp, unspecified: Secondary | ICD-10-CM | POA: Diagnosis not present

## 2013-02-15 DIAGNOSIS — Z79899 Other long term (current) drug therapy: Secondary | ICD-10-CM | POA: Diagnosis not present

## 2013-02-15 DIAGNOSIS — E559 Vitamin D deficiency, unspecified: Secondary | ICD-10-CM | POA: Diagnosis not present

## 2013-02-15 DIAGNOSIS — I10 Essential (primary) hypertension: Secondary | ICD-10-CM | POA: Diagnosis not present

## 2013-02-15 DIAGNOSIS — Z0000 Encounter for general adult medical examination without abnormal findings: Secondary | ICD-10-CM | POA: Diagnosis not present

## 2013-04-17 DIAGNOSIS — H6980 Other specified disorders of Eustachian tube, unspecified ear: Secondary | ICD-10-CM | POA: Diagnosis not present

## 2013-04-17 DIAGNOSIS — I10 Essential (primary) hypertension: Secondary | ICD-10-CM | POA: Diagnosis not present

## 2013-04-17 DIAGNOSIS — J339 Nasal polyp, unspecified: Secondary | ICD-10-CM | POA: Diagnosis not present

## 2013-07-15 DIAGNOSIS — M719 Bursopathy, unspecified: Secondary | ICD-10-CM | POA: Diagnosis not present

## 2013-07-15 DIAGNOSIS — I10 Essential (primary) hypertension: Secondary | ICD-10-CM | POA: Diagnosis not present

## 2013-07-15 DIAGNOSIS — M67919 Unspecified disorder of synovium and tendon, unspecified shoulder: Secondary | ICD-10-CM | POA: Diagnosis not present

## 2013-07-15 DIAGNOSIS — F3289 Other specified depressive episodes: Secondary | ICD-10-CM | POA: Diagnosis not present

## 2013-07-15 DIAGNOSIS — F329 Major depressive disorder, single episode, unspecified: Secondary | ICD-10-CM | POA: Diagnosis not present

## 2013-09-26 DIAGNOSIS — Z1231 Encounter for screening mammogram for malignant neoplasm of breast: Secondary | ICD-10-CM | POA: Diagnosis not present

## 2013-09-26 DIAGNOSIS — Z803 Family history of malignant neoplasm of breast: Secondary | ICD-10-CM | POA: Diagnosis not present

## 2013-11-17 DIAGNOSIS — S93409A Sprain of unspecified ligament of unspecified ankle, initial encounter: Secondary | ICD-10-CM | POA: Diagnosis not present

## 2013-12-17 DIAGNOSIS — F3289 Other specified depressive episodes: Secondary | ICD-10-CM | POA: Diagnosis not present

## 2013-12-17 DIAGNOSIS — Z23 Encounter for immunization: Secondary | ICD-10-CM | POA: Diagnosis not present

## 2013-12-17 DIAGNOSIS — S93409A Sprain of unspecified ligament of unspecified ankle, initial encounter: Secondary | ICD-10-CM | POA: Diagnosis not present

## 2013-12-17 DIAGNOSIS — E559 Vitamin D deficiency, unspecified: Secondary | ICD-10-CM | POA: Diagnosis not present

## 2013-12-17 DIAGNOSIS — F329 Major depressive disorder, single episode, unspecified: Secondary | ICD-10-CM | POA: Diagnosis not present

## 2013-12-17 DIAGNOSIS — B079 Viral wart, unspecified: Secondary | ICD-10-CM | POA: Diagnosis not present

## 2013-12-17 DIAGNOSIS — J301 Allergic rhinitis due to pollen: Secondary | ICD-10-CM | POA: Diagnosis not present

## 2013-12-17 DIAGNOSIS — I10 Essential (primary) hypertension: Secondary | ICD-10-CM | POA: Diagnosis not present

## 2014-02-27 DIAGNOSIS — Z0001 Encounter for general adult medical examination with abnormal findings: Secondary | ICD-10-CM | POA: Diagnosis not present

## 2014-02-27 DIAGNOSIS — Z1389 Encounter for screening for other disorder: Secondary | ICD-10-CM | POA: Diagnosis not present

## 2014-02-27 DIAGNOSIS — K219 Gastro-esophageal reflux disease without esophagitis: Secondary | ICD-10-CM | POA: Diagnosis not present

## 2014-02-27 DIAGNOSIS — F329 Major depressive disorder, single episode, unspecified: Secondary | ICD-10-CM | POA: Diagnosis not present

## 2014-02-27 DIAGNOSIS — Z23 Encounter for immunization: Secondary | ICD-10-CM | POA: Diagnosis not present

## 2014-02-27 DIAGNOSIS — I672 Cerebral atherosclerosis: Secondary | ICD-10-CM | POA: Diagnosis not present

## 2014-02-27 DIAGNOSIS — E780 Pure hypercholesterolemia: Secondary | ICD-10-CM | POA: Diagnosis not present

## 2014-02-27 DIAGNOSIS — E559 Vitamin D deficiency, unspecified: Secondary | ICD-10-CM | POA: Diagnosis not present

## 2014-02-27 DIAGNOSIS — I10 Essential (primary) hypertension: Secondary | ICD-10-CM | POA: Diagnosis not present

## 2014-04-07 DIAGNOSIS — R05 Cough: Secondary | ICD-10-CM | POA: Diagnosis not present

## 2014-04-07 DIAGNOSIS — I10 Essential (primary) hypertension: Secondary | ICD-10-CM | POA: Diagnosis not present

## 2014-04-07 DIAGNOSIS — L989 Disorder of the skin and subcutaneous tissue, unspecified: Secondary | ICD-10-CM | POA: Diagnosis not present

## 2014-04-22 ENCOUNTER — Other Ambulatory Visit: Payer: Self-pay | Admitting: Internal Medicine

## 2014-04-22 DIAGNOSIS — L570 Actinic keratosis: Secondary | ICD-10-CM | POA: Diagnosis not present

## 2014-04-22 DIAGNOSIS — H16139 Photokeratitis, unspecified eye: Secondary | ICD-10-CM | POA: Diagnosis not present

## 2014-05-02 DIAGNOSIS — L989 Disorder of the skin and subcutaneous tissue, unspecified: Secondary | ICD-10-CM | POA: Diagnosis not present

## 2014-06-03 DIAGNOSIS — J019 Acute sinusitis, unspecified: Secondary | ICD-10-CM | POA: Diagnosis not present

## 2014-06-03 DIAGNOSIS — R51 Headache: Secondary | ICD-10-CM | POA: Diagnosis not present

## 2014-06-24 DIAGNOSIS — T7840XA Allergy, unspecified, initial encounter: Secondary | ICD-10-CM | POA: Diagnosis not present

## 2014-11-17 DIAGNOSIS — I10 Essential (primary) hypertension: Secondary | ICD-10-CM | POA: Diagnosis not present

## 2014-12-08 DIAGNOSIS — J069 Acute upper respiratory infection, unspecified: Secondary | ICD-10-CM | POA: Diagnosis not present

## 2015-01-02 ENCOUNTER — Other Ambulatory Visit (HOSPITAL_COMMUNITY): Payer: Self-pay | Admitting: Neurosurgery

## 2015-01-02 DIAGNOSIS — I671 Cerebral aneurysm, nonruptured: Secondary | ICD-10-CM

## 2015-01-13 ENCOUNTER — Other Ambulatory Visit (HOSPITAL_COMMUNITY)
Admission: RE | Admit: 2015-01-13 | Discharge: 2015-01-13 | Disposition: A | Discharge status: Home / Self Care | Payer: Medicare Other | Attending: Neurosurgery | Admitting: Neurosurgery

## 2015-01-13 ENCOUNTER — Ambulatory Visit (HOSPITAL_COMMUNITY)
Admission: RE | Admit: 2015-01-13 | Discharge: 2015-01-13 | Disposition: A | Discharge status: Home / Self Care | Payer: Medicare Other | Source: Ambulatory Visit | Attending: Neurosurgery | Admitting: Neurosurgery

## 2015-01-13 DIAGNOSIS — I671 Cerebral aneurysm, nonruptured: Secondary | ICD-10-CM | POA: Insufficient documentation

## 2015-01-13 LAB — CREATININE, SERUM
CREATININE: 0.99 mg/dL (ref 0.44–1.00)
GFR calc Af Amer: >60 mL/min (ref >60)
GFR calc non Af Amer: 57 mL/min — ABNORMAL LOW (ref >60)

## 2015-01-13 LAB — BUN: BUN: 16 mg/dL (ref 6–20)

## 2015-01-15 ENCOUNTER — Ambulatory Visit (HOSPITAL_COMMUNITY)
Admission: RE | Admit: 2015-01-15 | Discharge: 2015-01-15 | Disposition: A | Discharge status: Home / Self Care | Payer: Medicare Other | Source: Ambulatory Visit | Attending: Neurosurgery | Admitting: Neurosurgery

## 2015-01-15 DIAGNOSIS — I671 Cerebral aneurysm, nonruptured: Secondary | ICD-10-CM | POA: Diagnosis not present

## 2015-01-15 DIAGNOSIS — G9389 Other specified disorders of brain: Secondary | ICD-10-CM | POA: Diagnosis not present

## 2015-01-15 MED ORDER — IOHEXOL 350 MG/ML SOLN
50.0000 mL | Freq: Once | INTRAVENOUS | Status: AC | PRN
  Administered 2015-01-15: 50 mL via INTRAVENOUS

## 2015-01-26 DIAGNOSIS — I10 Essential (primary) hypertension: Secondary | ICD-10-CM | POA: Diagnosis not present

## 2015-01-26 DIAGNOSIS — I671 Cerebral aneurysm, nonruptured: Secondary | ICD-10-CM | POA: Diagnosis not present

## 2015-01-29 DIAGNOSIS — R51 Headache: Secondary | ICD-10-CM | POA: Diagnosis not present

## 2015-02-05 DIAGNOSIS — R51 Headache: Secondary | ICD-10-CM | POA: Diagnosis not present

## 2015-03-04 DIAGNOSIS — R51 Headache: Secondary | ICD-10-CM | POA: Diagnosis not present

## 2015-03-30 DIAGNOSIS — Z23 Encounter for immunization: Secondary | ICD-10-CM | POA: Diagnosis not present

## 2015-05-25 DIAGNOSIS — H9202 Otalgia, left ear: Secondary | ICD-10-CM | POA: Diagnosis not present

## 2015-07-02 ENCOUNTER — Other Ambulatory Visit (HOSPITAL_COMMUNITY): Payer: Self-pay | Admitting: Internal Medicine

## 2015-07-02 DIAGNOSIS — G500 Trigeminal neuralgia: Secondary | ICD-10-CM | POA: Diagnosis not present

## 2015-07-02 DIAGNOSIS — Z1389 Encounter for screening for other disorder: Secondary | ICD-10-CM | POA: Diagnosis not present

## 2015-07-02 DIAGNOSIS — I6990 Unspecified sequelae of unspecified cerebrovascular disease: Secondary | ICD-10-CM | POA: Diagnosis not present

## 2015-07-02 DIAGNOSIS — E7800 Pure hypercholesterolemia, unspecified: Secondary | ICD-10-CM | POA: Diagnosis not present

## 2015-07-02 DIAGNOSIS — K219 Gastro-esophageal reflux disease without esophagitis: Secondary | ICD-10-CM | POA: Diagnosis not present

## 2015-07-02 DIAGNOSIS — I10 Essential (primary) hypertension: Secondary | ICD-10-CM | POA: Diagnosis not present

## 2015-07-02 DIAGNOSIS — R140 Abdominal distension (gaseous): Secondary | ICD-10-CM

## 2015-07-02 DIAGNOSIS — Z0001 Encounter for general adult medical examination with abnormal findings: Secondary | ICD-10-CM | POA: Diagnosis not present

## 2015-07-02 DIAGNOSIS — E559 Vitamin D deficiency, unspecified: Secondary | ICD-10-CM | POA: Diagnosis not present

## 2015-07-02 DIAGNOSIS — R5383 Other fatigue: Secondary | ICD-10-CM | POA: Diagnosis not present

## 2015-07-02 DIAGNOSIS — Z79899 Other long term (current) drug therapy: Secondary | ICD-10-CM | POA: Diagnosis not present

## 2015-07-02 DIAGNOSIS — I672 Cerebral atherosclerosis: Secondary | ICD-10-CM | POA: Diagnosis not present

## 2015-07-02 DIAGNOSIS — Z8509 Personal history of malignant neoplasm of other digestive organs: Secondary | ICD-10-CM | POA: Diagnosis not present

## 2015-07-09 ENCOUNTER — Other Ambulatory Visit (HOSPITAL_COMMUNITY): Payer: Self-pay | Admitting: Internal Medicine

## 2015-07-09 ENCOUNTER — Encounter (HOSPITAL_COMMUNITY): Payer: Self-pay

## 2015-07-09 ENCOUNTER — Ambulatory Visit (HOSPITAL_COMMUNITY)
Admission: RE | Admit: 2015-07-09 | Discharge: 2015-07-09 | Disposition: A | Discharge status: Home / Self Care | Payer: Medicare Other | Source: Ambulatory Visit | Attending: Internal Medicine | Admitting: Internal Medicine

## 2015-07-09 DIAGNOSIS — R140 Abdominal distension (gaseous): Secondary | ICD-10-CM | POA: Insufficient documentation

## 2015-07-09 DIAGNOSIS — I7090 Unspecified atherosclerosis: Secondary | ICD-10-CM | POA: Diagnosis not present

## 2015-07-09 DIAGNOSIS — N8189 Other female genital prolapse: Secondary | ICD-10-CM | POA: Insufficient documentation

## 2015-07-09 DIAGNOSIS — D1803 Hemangioma of intra-abdominal structures: Secondary | ICD-10-CM | POA: Insufficient documentation

## 2015-07-09 DIAGNOSIS — R109 Unspecified abdominal pain: Secondary | ICD-10-CM | POA: Diagnosis not present

## 2015-07-09 HISTORY — DX: Essential (primary) hypertension: I10

## 2015-07-09 MED ORDER — IOPAMIDOL (ISOVUE-300) INJECTION 61%
100.0000 mL | Freq: Once | INTRAVENOUS | Status: AC | PRN
  Administered 2015-07-09: 100 mL via INTRAVENOUS

## 2015-07-17 DIAGNOSIS — E7800 Pure hypercholesterolemia, unspecified: Secondary | ICD-10-CM | POA: Diagnosis not present

## 2015-07-17 DIAGNOSIS — E876 Hypokalemia: Secondary | ICD-10-CM | POA: Diagnosis not present

## 2015-07-17 DIAGNOSIS — E559 Vitamin D deficiency, unspecified: Secondary | ICD-10-CM | POA: Diagnosis not present

## 2015-07-17 DIAGNOSIS — R739 Hyperglycemia, unspecified: Secondary | ICD-10-CM | POA: Diagnosis not present

## 2015-07-17 DIAGNOSIS — R140 Abdominal distension (gaseous): Secondary | ICD-10-CM | POA: Diagnosis not present

## 2015-08-17 DIAGNOSIS — H532 Diplopia: Secondary | ICD-10-CM | POA: Diagnosis not present

## 2015-08-17 DIAGNOSIS — R51 Headache: Secondary | ICD-10-CM | POA: Diagnosis not present

## 2015-09-18 DIAGNOSIS — E7800 Pure hypercholesterolemia, unspecified: Secondary | ICD-10-CM | POA: Diagnosis not present

## 2015-09-18 DIAGNOSIS — E559 Vitamin D deficiency, unspecified: Secondary | ICD-10-CM | POA: Diagnosis not present

## 2015-09-18 DIAGNOSIS — Z0000 Encounter for general adult medical examination without abnormal findings: Secondary | ICD-10-CM | POA: Diagnosis not present

## 2015-09-18 DIAGNOSIS — H532 Diplopia: Secondary | ICD-10-CM | POA: Diagnosis not present

## 2015-09-18 DIAGNOSIS — R739 Hyperglycemia, unspecified: Secondary | ICD-10-CM | POA: Diagnosis not present

## 2015-09-18 DIAGNOSIS — R140 Abdominal distension (gaseous): Secondary | ICD-10-CM | POA: Diagnosis not present

## 2015-09-18 DIAGNOSIS — E876 Hypokalemia: Secondary | ICD-10-CM | POA: Diagnosis not present

## 2015-09-18 DIAGNOSIS — R51 Headache: Secondary | ICD-10-CM | POA: Diagnosis not present

## 2015-09-28 DIAGNOSIS — Z803 Family history of malignant neoplasm of breast: Secondary | ICD-10-CM | POA: Diagnosis not present

## 2015-09-28 DIAGNOSIS — Z1231 Encounter for screening mammogram for malignant neoplasm of breast: Secondary | ICD-10-CM | POA: Diagnosis not present

## 2016-02-13 DIAGNOSIS — Z23 Encounter for immunization: Secondary | ICD-10-CM | POA: Diagnosis not present

## 2016-02-28 DIAGNOSIS — J189 Pneumonia, unspecified organism: Secondary | ICD-10-CM | POA: Diagnosis not present

## 2016-03-02 ENCOUNTER — Other Ambulatory Visit: Payer: Self-pay | Admitting: Internal Medicine

## 2016-03-02 ENCOUNTER — Ambulatory Visit
Admission: RE | Admit: 2016-03-02 | Discharge: 2016-03-02 | Disposition: A | Discharge status: Home / Self Care | Payer: PRIVATE HEALTH INSURANCE | Source: Ambulatory Visit | Attending: Internal Medicine | Admitting: Internal Medicine

## 2016-03-02 DIAGNOSIS — R05 Cough: Secondary | ICD-10-CM | POA: Diagnosis not present

## 2016-03-02 DIAGNOSIS — R059 Cough, unspecified: Secondary | ICD-10-CM

## 2016-03-02 DIAGNOSIS — J189 Pneumonia, unspecified organism: Secondary | ICD-10-CM | POA: Diagnosis not present

## 2016-04-01 DIAGNOSIS — R51 Headache: Secondary | ICD-10-CM | POA: Diagnosis not present

## 2016-04-01 DIAGNOSIS — H532 Diplopia: Secondary | ICD-10-CM | POA: Diagnosis not present

## 2016-04-01 DIAGNOSIS — R739 Hyperglycemia, unspecified: Secondary | ICD-10-CM | POA: Diagnosis not present

## 2016-04-01 DIAGNOSIS — J189 Pneumonia, unspecified organism: Secondary | ICD-10-CM | POA: Diagnosis not present

## 2016-04-01 DIAGNOSIS — E559 Vitamin D deficiency, unspecified: Secondary | ICD-10-CM | POA: Diagnosis not present

## 2016-04-01 DIAGNOSIS — R140 Abdominal distension (gaseous): Secondary | ICD-10-CM | POA: Diagnosis not present

## 2016-04-01 DIAGNOSIS — E7800 Pure hypercholesterolemia, unspecified: Secondary | ICD-10-CM | POA: Diagnosis not present

## 2016-04-01 DIAGNOSIS — R05 Cough: Secondary | ICD-10-CM | POA: Diagnosis not present

## 2016-04-01 DIAGNOSIS — E876 Hypokalemia: Secondary | ICD-10-CM | POA: Diagnosis not present

## 2016-07-07 DIAGNOSIS — E559 Vitamin D deficiency, unspecified: Secondary | ICD-10-CM | POA: Diagnosis not present

## 2016-07-07 DIAGNOSIS — R51 Headache: Secondary | ICD-10-CM | POA: Diagnosis not present

## 2016-07-07 DIAGNOSIS — N390 Urinary tract infection, site not specified: Secondary | ICD-10-CM | POA: Diagnosis not present

## 2016-07-07 DIAGNOSIS — F329 Major depressive disorder, single episode, unspecified: Secondary | ICD-10-CM | POA: Diagnosis not present

## 2016-07-07 DIAGNOSIS — R739 Hyperglycemia, unspecified: Secondary | ICD-10-CM | POA: Diagnosis not present

## 2016-07-07 DIAGNOSIS — I10 Essential (primary) hypertension: Secondary | ICD-10-CM | POA: Diagnosis not present

## 2016-07-07 DIAGNOSIS — Z1389 Encounter for screening for other disorder: Secondary | ICD-10-CM | POA: Diagnosis not present

## 2016-07-07 DIAGNOSIS — E876 Hypokalemia: Secondary | ICD-10-CM | POA: Diagnosis not present

## 2016-07-07 DIAGNOSIS — Z79899 Other long term (current) drug therapy: Secondary | ICD-10-CM | POA: Diagnosis not present

## 2016-07-07 DIAGNOSIS — H532 Diplopia: Secondary | ICD-10-CM | POA: Diagnosis not present

## 2016-07-07 DIAGNOSIS — E7800 Pure hypercholesterolemia, unspecified: Secondary | ICD-10-CM | POA: Diagnosis not present

## 2016-07-07 DIAGNOSIS — Z0000 Encounter for general adult medical examination without abnormal findings: Secondary | ICD-10-CM | POA: Diagnosis not present

## 2016-09-17 DIAGNOSIS — K122 Cellulitis and abscess of mouth: Secondary | ICD-10-CM | POA: Diagnosis not present

## 2016-09-21 ENCOUNTER — Encounter (HOSPITAL_COMMUNITY): Payer: Self-pay | Admitting: *Deleted

## 2016-09-21 ENCOUNTER — Emergency Department (HOSPITAL_COMMUNITY)
Admission: EM | Admit: 2016-09-21 | Discharge: 2016-09-21 | Disposition: A | Discharge status: Home / Self Care | Payer: Medicare Other | Attending: Emergency Medicine | Admitting: Emergency Medicine

## 2016-09-21 ENCOUNTER — Emergency Department (HOSPITAL_COMMUNITY): Payer: Medicare Other

## 2016-09-21 DIAGNOSIS — R0781 Pleurodynia: Secondary | ICD-10-CM | POA: Insufficient documentation

## 2016-09-21 DIAGNOSIS — Y92410 Unspecified street and highway as the place of occurrence of the external cause: Secondary | ICD-10-CM | POA: Insufficient documentation

## 2016-09-21 DIAGNOSIS — Y999 Unspecified external cause status: Secondary | ICD-10-CM | POA: Diagnosis not present

## 2016-09-21 DIAGNOSIS — I10 Essential (primary) hypertension: Secondary | ICD-10-CM | POA: Insufficient documentation

## 2016-09-21 DIAGNOSIS — R079 Chest pain, unspecified: Secondary | ICD-10-CM | POA: Diagnosis not present

## 2016-09-21 DIAGNOSIS — Y9389 Activity, other specified: Secondary | ICD-10-CM | POA: Insufficient documentation

## 2016-09-21 DIAGNOSIS — R0602 Shortness of breath: Secondary | ICD-10-CM | POA: Diagnosis not present

## 2016-09-21 MED ORDER — OXYCODONE-ACETAMINOPHEN 5-325 MG PO TABS
1.0000 | ORAL_TABLET | ORAL | 0 refills | Status: DC | PRN
Start: 2016-09-21 — End: 2019-11-22

## 2016-09-21 MED ORDER — KETOROLAC TROMETHAMINE 60 MG/2ML IM SOLN
30.0000 mg | Freq: Once | INTRAMUSCULAR | Status: AC
  Administered 2016-09-21: 30 mg via INTRAMUSCULAR
  Filled 2016-09-21: qty 2

## 2016-09-21 MED ORDER — IBUPROFEN 400 MG PO TABS: 400.0000 mg | ORAL_TABLET | Freq: Four times a day (QID) | ORAL | 0 refills | Status: DC

## 2016-09-21 MED ORDER — OXYCODONE-ACETAMINOPHEN 5-325 MG PO TABS
1.0000 | ORAL_TABLET | Freq: Once | ORAL | Status: AC
  Administered 2016-09-21: 1 via ORAL
  Filled 2016-09-21: qty 1

## 2016-09-21 MED ORDER — ACETAMINOPHEN 500 MG PO TABS: 500.0000 mg | ORAL_TABLET | Freq: Four times a day (QID) | ORAL | 0 refills | Status: DC
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
## 2016-09-21 NOTE — ED Notes (Signed)
Pt ambulatory and independent at discharge.  Verbalized understanding of discharge instructions 

## 2016-09-21 NOTE — Discharge Instructions (Signed)
Please use your incentive spirometer every 30-60 minutes while awake.  Please limit use of prescription pain medicine and try to just take tylenol/ibuprofen every 6 hours if possible.

## 2016-11-08 ENCOUNTER — Other Ambulatory Visit: Payer: Self-pay | Admitting: Internal Medicine

## 2016-11-08 ENCOUNTER — Ambulatory Visit
Admission: RE | Admit: 2016-11-08 | Discharge: 2016-11-08 | Disposition: A | Discharge status: Home / Self Care | Payer: Medicare Other | Source: Ambulatory Visit | Attending: Internal Medicine | Admitting: Internal Medicine

## 2016-11-08 DIAGNOSIS — R05 Cough: Secondary | ICD-10-CM

## 2016-11-08 DIAGNOSIS — R058 Other specified cough: Secondary | ICD-10-CM

## 2016-11-08 DIAGNOSIS — R0689 Other abnormalities of breathing: Secondary | ICD-10-CM | POA: Diagnosis not present

## 2016-11-08 DIAGNOSIS — R51 Headache: Secondary | ICD-10-CM | POA: Diagnosis not present

## 2016-11-15 DIAGNOSIS — R8290 Unspecified abnormal findings in urine: Secondary | ICD-10-CM | POA: Diagnosis not present

## 2016-11-15 DIAGNOSIS — R51 Headache: Secondary | ICD-10-CM | POA: Diagnosis not present

## 2016-11-15 DIAGNOSIS — I10 Essential (primary) hypertension: Secondary | ICD-10-CM | POA: Diagnosis not present

## 2016-11-15 DIAGNOSIS — R05 Cough: Secondary | ICD-10-CM | POA: Diagnosis not present

## 2016-11-15 DIAGNOSIS — R0689 Other abnormalities of breathing: Secondary | ICD-10-CM | POA: Diagnosis not present

## 2016-11-15 DIAGNOSIS — R110 Nausea: Secondary | ICD-10-CM | POA: Diagnosis not present

## 2016-11-15 DIAGNOSIS — E876 Hypokalemia: Secondary | ICD-10-CM | POA: Diagnosis not present

## 2016-11-18 ENCOUNTER — Other Ambulatory Visit (HOSPITAL_COMMUNITY): Payer: Self-pay | Admitting: Neurosurgery

## 2016-11-18 DIAGNOSIS — I671 Cerebral aneurysm, nonruptured: Secondary | ICD-10-CM

## 2016-11-25 DIAGNOSIS — R51 Headache: Secondary | ICD-10-CM | POA: Diagnosis not present

## 2016-11-25 DIAGNOSIS — R110 Nausea: Secondary | ICD-10-CM | POA: Diagnosis not present

## 2016-11-25 DIAGNOSIS — E876 Hypokalemia: Secondary | ICD-10-CM | POA: Diagnosis not present

## 2016-11-25 DIAGNOSIS — Z87440 Personal history of urinary (tract) infections: Secondary | ICD-10-CM | POA: Diagnosis not present

## 2016-11-25 DIAGNOSIS — I10 Essential (primary) hypertension: Secondary | ICD-10-CM | POA: Diagnosis not present

## 2017-01-10 ENCOUNTER — Ambulatory Visit (HOSPITAL_COMMUNITY)
Admission: RE | Admit: 2017-01-10 | Discharge: 2017-01-10 | Disposition: A | Discharge status: Home / Self Care | Payer: Medicare Other | Source: Ambulatory Visit | Attending: Neurosurgery | Admitting: Neurosurgery

## 2017-01-10 ENCOUNTER — Encounter (HOSPITAL_COMMUNITY): Payer: Self-pay

## 2017-01-16 DIAGNOSIS — E876 Hypokalemia: Secondary | ICD-10-CM | POA: Diagnosis not present

## 2017-01-16 DIAGNOSIS — Z23 Encounter for immunization: Secondary | ICD-10-CM | POA: Diagnosis not present

## 2017-01-16 DIAGNOSIS — I10 Essential (primary) hypertension: Secondary | ICD-10-CM | POA: Diagnosis not present

## 2017-01-26 ENCOUNTER — Ambulatory Visit (HOSPITAL_COMMUNITY)
Admission: RE | Admit: 2017-01-26 | Discharge: 2017-01-26 | Disposition: A | Discharge status: Home / Self Care | Payer: Medicare Other | Source: Ambulatory Visit | Attending: Neurosurgery | Admitting: Neurosurgery

## 2017-01-26 DIAGNOSIS — I6381 Other cerebral infarction due to occlusion or stenosis of small artery: Secondary | ICD-10-CM | POA: Diagnosis not present

## 2017-01-26 DIAGNOSIS — I6782 Cerebral ischemia: Secondary | ICD-10-CM | POA: Insufficient documentation

## 2017-01-26 DIAGNOSIS — I671 Cerebral aneurysm, nonruptured: Secondary | ICD-10-CM | POA: Diagnosis not present

## 2017-01-26 LAB — POCT I-STAT CREATININE: Creatinine, Ser: 1 mg/dL (ref 0.44–1.00)

## 2017-01-26 MED ORDER — IOPAMIDOL (ISOVUE-370) INJECTION 76%
INTRAVENOUS | Status: AC
  Administered 2017-01-26: 50 mL
  Filled 2017-01-26: qty 50

## 2017-02-02 DIAGNOSIS — I671 Cerebral aneurysm, nonruptured: Secondary | ICD-10-CM | POA: Diagnosis not present

## 2017-05-31 DIAGNOSIS — K5790 Diverticulosis of intestine, part unspecified, without perforation or abscess without bleeding: Secondary | ICD-10-CM | POA: Diagnosis not present

## 2017-05-31 DIAGNOSIS — K5792 Diverticulitis of intestine, part unspecified, without perforation or abscess without bleeding: Secondary | ICD-10-CM | POA: Diagnosis not present

## 2017-06-01 ENCOUNTER — Other Ambulatory Visit: Payer: Self-pay | Admitting: Internal Medicine

## 2017-06-01 DIAGNOSIS — R1032 Left lower quadrant pain: Secondary | ICD-10-CM

## 2017-06-02 ENCOUNTER — Other Ambulatory Visit: Payer: Self-pay | Admitting: Internal Medicine

## 2017-06-02 ENCOUNTER — Ambulatory Visit
Admission: RE | Admit: 2017-06-02 | Discharge: 2017-06-02 | Disposition: A | Discharge status: Home / Self Care | Payer: Medicare Other | Source: Ambulatory Visit | Attending: Internal Medicine | Admitting: Internal Medicine

## 2017-06-02 DIAGNOSIS — R1012 Left upper quadrant pain: Secondary | ICD-10-CM | POA: Diagnosis not present

## 2017-06-02 DIAGNOSIS — R1032 Left lower quadrant pain: Secondary | ICD-10-CM

## 2017-06-06 ENCOUNTER — Other Ambulatory Visit: Payer: Self-pay | Admitting: Internal Medicine

## 2017-08-02 DIAGNOSIS — I10 Essential (primary) hypertension: Secondary | ICD-10-CM | POA: Diagnosis not present

## 2017-08-02 DIAGNOSIS — I672 Cerebral atherosclerosis: Secondary | ICD-10-CM | POA: Diagnosis not present

## 2017-08-02 DIAGNOSIS — G500 Trigeminal neuralgia: Secondary | ICD-10-CM | POA: Diagnosis not present

## 2017-08-02 DIAGNOSIS — Z1239 Encounter for other screening for malignant neoplasm of breast: Secondary | ICD-10-CM | POA: Diagnosis not present

## 2017-08-02 DIAGNOSIS — R079 Chest pain, unspecified: Secondary | ICD-10-CM | POA: Diagnosis not present

## 2017-08-02 DIAGNOSIS — R739 Hyperglycemia, unspecified: Secondary | ICD-10-CM | POA: Diagnosis not present

## 2017-08-02 DIAGNOSIS — K5790 Diverticulosis of intestine, part unspecified, without perforation or abscess without bleeding: Secondary | ICD-10-CM | POA: Diagnosis not present

## 2017-08-02 DIAGNOSIS — E559 Vitamin D deficiency, unspecified: Secondary | ICD-10-CM | POA: Diagnosis not present

## 2017-08-02 DIAGNOSIS — Z1389 Encounter for screening for other disorder: Secondary | ICD-10-CM | POA: Diagnosis not present

## 2017-08-02 DIAGNOSIS — Z13820 Encounter for screening for osteoporosis: Secondary | ICD-10-CM | POA: Diagnosis not present

## 2017-08-02 DIAGNOSIS — Z7189 Other specified counseling: Secondary | ICD-10-CM | POA: Diagnosis not present

## 2017-08-02 DIAGNOSIS — F329 Major depressive disorder, single episode, unspecified: Secondary | ICD-10-CM | POA: Diagnosis not present

## 2017-08-02 DIAGNOSIS — R1012 Left upper quadrant pain: Secondary | ICD-10-CM | POA: Diagnosis not present

## 2017-08-02 DIAGNOSIS — Z79899 Other long term (current) drug therapy: Secondary | ICD-10-CM | POA: Diagnosis not present

## 2017-08-02 DIAGNOSIS — Z0000 Encounter for general adult medical examination without abnormal findings: Secondary | ICD-10-CM | POA: Diagnosis not present

## 2017-08-02 DIAGNOSIS — I700 Atherosclerosis of aorta: Secondary | ICD-10-CM | POA: Diagnosis not present

## 2017-08-02 DIAGNOSIS — E782 Mixed hyperlipidemia: Secondary | ICD-10-CM | POA: Diagnosis not present

## 2017-08-02 DIAGNOSIS — Z1211 Encounter for screening for malignant neoplasm of colon: Secondary | ICD-10-CM | POA: Diagnosis not present

## 2017-08-02 DIAGNOSIS — K219 Gastro-esophageal reflux disease without esophagitis: Secondary | ICD-10-CM | POA: Diagnosis not present

## 2017-08-18 DIAGNOSIS — R591 Generalized enlarged lymph nodes: Secondary | ICD-10-CM | POA: Diagnosis not present

## 2017-08-18 DIAGNOSIS — Z20818 Contact with and (suspected) exposure to other bacterial communicable diseases: Secondary | ICD-10-CM | POA: Diagnosis not present

## 2017-08-24 ENCOUNTER — Other Ambulatory Visit: Payer: Self-pay | Admitting: Nurse Practitioner

## 2017-08-24 DIAGNOSIS — M542 Cervicalgia: Secondary | ICD-10-CM

## 2017-08-24 DIAGNOSIS — R1310 Dysphagia, unspecified: Secondary | ICD-10-CM | POA: Diagnosis not present

## 2017-08-24 DIAGNOSIS — R591 Generalized enlarged lymph nodes: Secondary | ICD-10-CM | POA: Diagnosis not present

## 2017-08-31 ENCOUNTER — Ambulatory Visit
Admission: RE | Admit: 2017-08-31 | Discharge: 2017-08-31 | Disposition: A | Discharge status: Home / Self Care | Payer: Medicare Other | Source: Ambulatory Visit | Attending: Nurse Practitioner | Admitting: Nurse Practitioner

## 2017-08-31 DIAGNOSIS — R221 Localized swelling, mass and lump, neck: Secondary | ICD-10-CM | POA: Diagnosis not present

## 2017-08-31 DIAGNOSIS — M542 Cervicalgia: Secondary | ICD-10-CM

## 2017-09-05 DIAGNOSIS — I700 Atherosclerosis of aorta: Secondary | ICD-10-CM | POA: Diagnosis not present

## 2017-09-05 DIAGNOSIS — R079 Chest pain, unspecified: Secondary | ICD-10-CM | POA: Diagnosis not present

## 2017-09-05 DIAGNOSIS — I10 Essential (primary) hypertension: Secondary | ICD-10-CM | POA: Diagnosis not present

## 2017-09-05 DIAGNOSIS — G500 Trigeminal neuralgia: Secondary | ICD-10-CM | POA: Diagnosis not present

## 2017-09-05 DIAGNOSIS — R1012 Left upper quadrant pain: Secondary | ICD-10-CM | POA: Diagnosis not present

## 2017-09-05 DIAGNOSIS — I672 Cerebral atherosclerosis: Secondary | ICD-10-CM | POA: Diagnosis not present

## 2017-09-05 DIAGNOSIS — R3915 Urgency of urination: Secondary | ICD-10-CM | POA: Diagnosis not present

## 2017-09-05 DIAGNOSIS — E559 Vitamin D deficiency, unspecified: Secondary | ICD-10-CM | POA: Diagnosis not present

## 2017-09-05 DIAGNOSIS — K5790 Diverticulosis of intestine, part unspecified, without perforation or abscess without bleeding: Secondary | ICD-10-CM | POA: Diagnosis not present

## 2017-09-05 DIAGNOSIS — R739 Hyperglycemia, unspecified: Secondary | ICD-10-CM | POA: Diagnosis not present

## 2017-09-05 DIAGNOSIS — E782 Mixed hyperlipidemia: Secondary | ICD-10-CM | POA: Diagnosis not present

## 2017-09-05 DIAGNOSIS — R32 Unspecified urinary incontinence: Secondary | ICD-10-CM | POA: Diagnosis not present

## 2017-11-15 DIAGNOSIS — N8111 Cystocele, midline: Secondary | ICD-10-CM | POA: Diagnosis not present

## 2017-11-15 DIAGNOSIS — N952 Postmenopausal atrophic vaginitis: Secondary | ICD-10-CM | POA: Diagnosis not present

## 2017-11-15 DIAGNOSIS — N393 Stress incontinence (female) (male): Secondary | ICD-10-CM | POA: Diagnosis not present

## 2017-11-15 DIAGNOSIS — N819 Female genital prolapse, unspecified: Secondary | ICD-10-CM | POA: Diagnosis not present

## 2017-11-15 DIAGNOSIS — N816 Rectocele: Secondary | ICD-10-CM | POA: Diagnosis not present

## 2017-11-16 IMAGING — CT CT ANGIO HEAD
3 of 12 series · 8 of 47 positions shown · IV contrast (Omni 300)
Comparison: 01/15/2015 CT angiogram of the head

CLINICAL DATA: 71 y/o  F; nonruptured cerebral aneurysm.

EXAM:
CT ANGIOGRAPHY HEAD
TECHNIQUE: Multidetector CT imaging of the head was performed using the
standard protocol during bolus administration of intravenous
contrast. Multiplanar CT image reconstructions and MIPs were
obtained to evaluate the vascular anatomy.
CONTRAST:  50 cc Isovue 370

[Series 8: head bone · axial · 0.42mm/px · z∈[-36,+42]mm · 3 of 79 slices shown]
[im 20/79  bone]
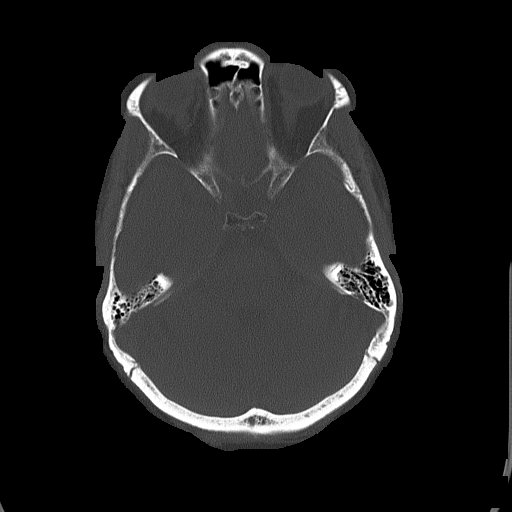
[im 40/79  bone]
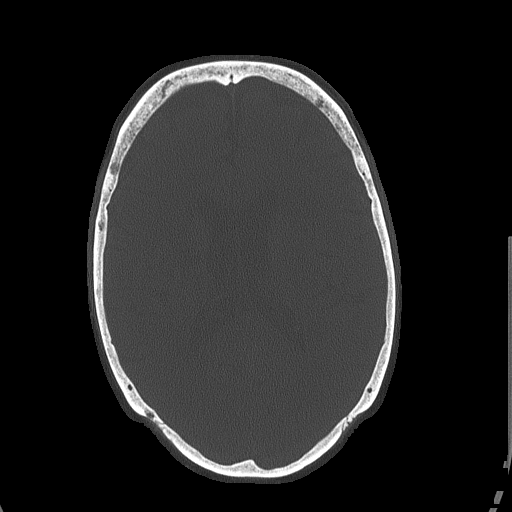
[im 59/79  bone]
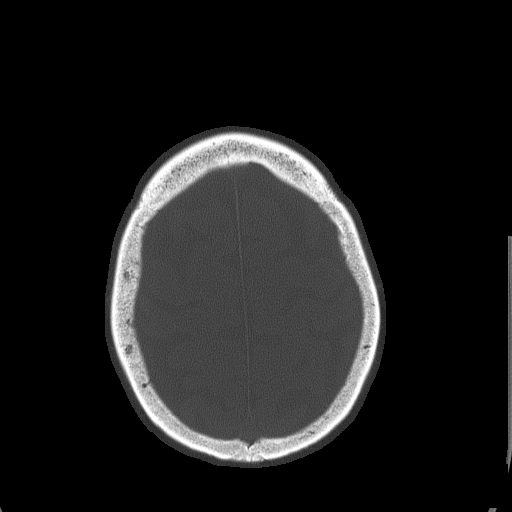

[Series 9: coronal · axial · 0.32mm/px · z∈[-45,+11]mm · 2 of 58 slices shown]
[im 20/58  brain]
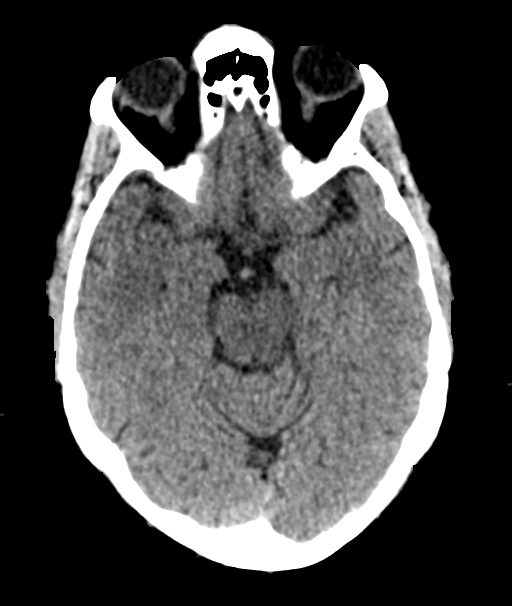
[im 39/58  brain]
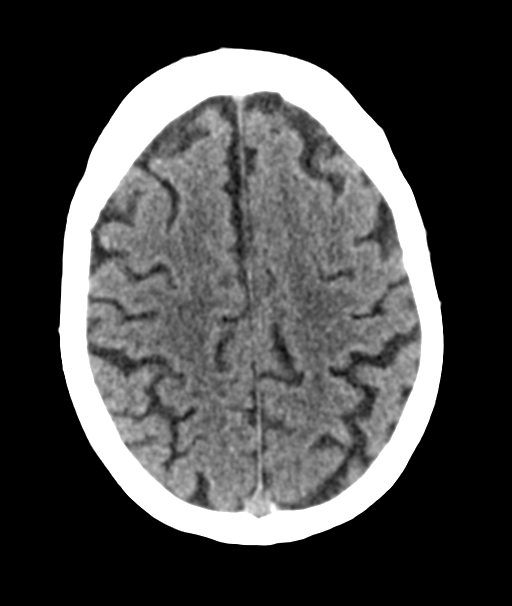

[Series 11: cow 2.0 · axial · 0.42mm/px · z∈[-68,+12]mm · 3 of 82 slices shown]
[im 21/82  brain]
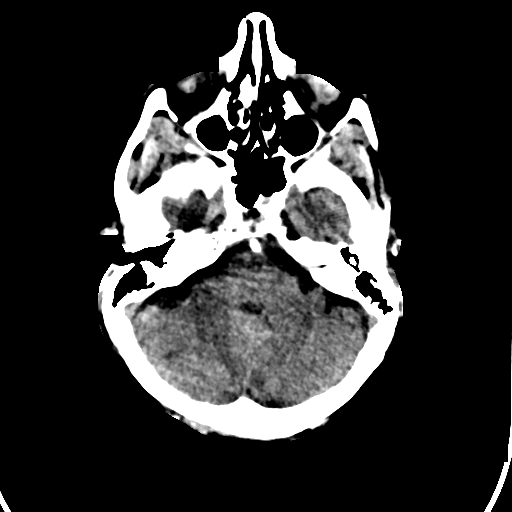
[im 41/82  bone]
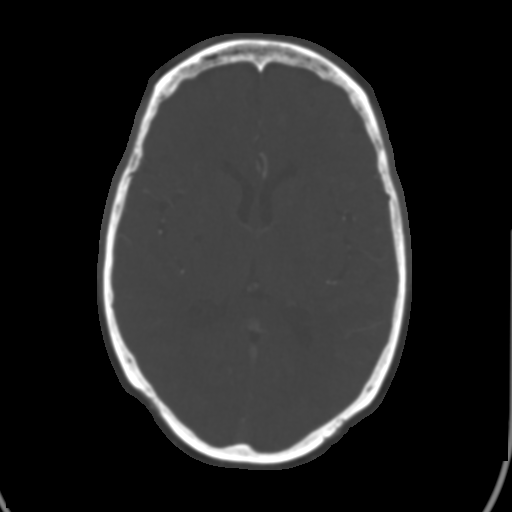
[im 61/82  brain]
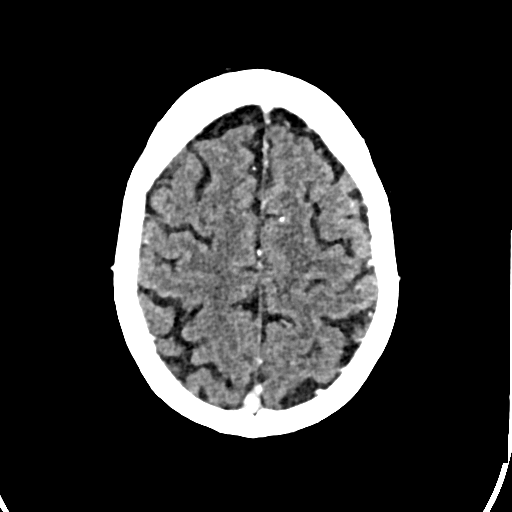

[8 of 47 positions shown; findings below may reference images not displayed]

FINDINGS: CT HEAD

Brain: No evidence of acute infarction, hemorrhage, hydrocephalus,
extra-axial collection or mass lesion/mass effect. Stable chronic
lacunar infarct within right lentiform nucleus extending into corona
radiata. Stable chronic microvascular ischemic changes of white
matter. Partially empty sella turcica.

Vascular: As below

Skull: Normal. Negative for acute fracture or focal lesion.

Sinuses: Right posterior ethmoid air cell mucous retention cyst.
Chronic right lamina papyracea see a fracture with herniation of
extraconal fat into the ethmoid air cells. Mild mucosal thickening
of maxillary sinuses. Normal aeration of mastoid air cells.

Orbits: Negative.  New

CTA HEAD

Anterior circulation: Stable fusiform dilatation of right distal M1/
proximal inferior M2 measuring up to 3 mm in diameter. Right distal
cavernous ICA atherosclerosis with mild stenosis. Otherwise no large
vessel occlusion, aneurysm, or significant stenosis is identified.

Posterior circulation: No significant stenosis, proximal occlusion,
aneurysm, or vascular malformation.

Venous sinuses: As permitted by contrast timing, patent.

Anatomic variants: Patent anterior communicating artery. No
posterior communicating artery identified, likely hypoplastic or
absent.

Delayed phase: No abnormal intracranial enhancement.
IMPRESSION: 1. No acute intracranial abnormality.
2. Stable chronic lacunar infarct in right lentiform nucleus and mid
corona radiata. Stable chronic microvascular ischemic changes of
white matter.
3. Stable fusiform dilatation of right distal M1 and proximal
inferior M2.
4. Otherwise no large vessel occlusion, aneurysm, or high-grade
stenosis is identified.

By: Ben Hsine Gaddes M.D.

## 2017-11-23 DIAGNOSIS — M8588 Other specified disorders of bone density and structure, other site: Secondary | ICD-10-CM | POA: Diagnosis not present

## 2017-11-24 DIAGNOSIS — Z800 Family history of malignant neoplasm of digestive organs: Secondary | ICD-10-CM | POA: Diagnosis not present

## 2017-11-24 DIAGNOSIS — Z86010 Personal history of colonic polyps: Secondary | ICD-10-CM | POA: Diagnosis not present

## 2017-12-06 DIAGNOSIS — N393 Stress incontinence (female) (male): Secondary | ICD-10-CM | POA: Diagnosis not present

## 2017-12-06 DIAGNOSIS — N3946 Mixed incontinence: Secondary | ICD-10-CM | POA: Diagnosis not present

## 2017-12-06 DIAGNOSIS — N819 Female genital prolapse, unspecified: Secondary | ICD-10-CM | POA: Diagnosis not present

## 2017-12-06 DIAGNOSIS — N8111 Cystocele, midline: Secondary | ICD-10-CM | POA: Diagnosis not present

## 2017-12-06 DIAGNOSIS — R338 Other retention of urine: Secondary | ICD-10-CM | POA: Diagnosis not present

## 2017-12-08 DIAGNOSIS — D123 Benign neoplasm of transverse colon: Secondary | ICD-10-CM | POA: Diagnosis not present

## 2017-12-08 DIAGNOSIS — K5730 Diverticulosis of large intestine without perforation or abscess without bleeding: Secondary | ICD-10-CM | POA: Diagnosis not present

## 2017-12-08 DIAGNOSIS — Z800 Family history of malignant neoplasm of digestive organs: Secondary | ICD-10-CM | POA: Diagnosis not present

## 2017-12-08 DIAGNOSIS — Z86010 Personal history of colonic polyps: Secondary | ICD-10-CM | POA: Diagnosis not present

## 2017-12-08 DIAGNOSIS — K6289 Other specified diseases of anus and rectum: Secondary | ICD-10-CM | POA: Diagnosis not present

## 2017-12-08 DIAGNOSIS — K5289 Other specified noninfective gastroenteritis and colitis: Secondary | ICD-10-CM | POA: Diagnosis not present

## 2017-12-08 HISTORY — PX: COLONOSCOPY: SHX5424

## 2017-12-13 DIAGNOSIS — K5289 Other specified noninfective gastroenteritis and colitis: Secondary | ICD-10-CM | POA: Diagnosis not present

## 2017-12-13 DIAGNOSIS — D123 Benign neoplasm of transverse colon: Secondary | ICD-10-CM | POA: Diagnosis not present

## 2017-12-28 DIAGNOSIS — K660 Peritoneal adhesions (postprocedural) (postinfection): Secondary | ICD-10-CM | POA: Diagnosis not present

## 2017-12-28 DIAGNOSIS — N8110 Cystocele, unspecified: Secondary | ICD-10-CM | POA: Diagnosis not present

## 2017-12-28 DIAGNOSIS — I4581 Long QT syndrome: Secondary | ICD-10-CM | POA: Diagnosis not present

## 2017-12-28 DIAGNOSIS — Z9889 Other specified postprocedural states: Secondary | ICD-10-CM | POA: Diagnosis not present

## 2017-12-28 DIAGNOSIS — N393 Stress incontinence (female) (male): Secondary | ICD-10-CM | POA: Diagnosis not present

## 2017-12-28 DIAGNOSIS — N813 Complete uterovaginal prolapse: Secondary | ICD-10-CM | POA: Diagnosis not present

## 2017-12-28 DIAGNOSIS — R001 Bradycardia, unspecified: Secondary | ICD-10-CM | POA: Diagnosis not present

## 2017-12-29 DIAGNOSIS — K660 Peritoneal adhesions (postprocedural) (postinfection): Secondary | ICD-10-CM | POA: Diagnosis not present

## 2017-12-29 DIAGNOSIS — I4581 Long QT syndrome: Secondary | ICD-10-CM | POA: Diagnosis not present

## 2017-12-29 DIAGNOSIS — Z9889 Other specified postprocedural states: Secondary | ICD-10-CM | POA: Diagnosis not present

## 2017-12-29 DIAGNOSIS — R001 Bradycardia, unspecified: Secondary | ICD-10-CM | POA: Diagnosis not present

## 2017-12-29 DIAGNOSIS — N8110 Cystocele, unspecified: Secondary | ICD-10-CM | POA: Diagnosis not present

## 2017-12-29 DIAGNOSIS — N393 Stress incontinence (female) (male): Secondary | ICD-10-CM | POA: Diagnosis not present

## 2018-01-06 DIAGNOSIS — N390 Urinary tract infection, site not specified: Secondary | ICD-10-CM | POA: Diagnosis not present

## 2018-11-01 DIAGNOSIS — H04123 Dry eye syndrome of bilateral lacrimal glands: Secondary | ICD-10-CM | POA: Diagnosis not present

## 2018-11-01 DIAGNOSIS — H25013 Cortical age-related cataract, bilateral: Secondary | ICD-10-CM | POA: Diagnosis not present

## 2018-11-01 DIAGNOSIS — H43393 Other vitreous opacities, bilateral: Secondary | ICD-10-CM | POA: Diagnosis not present

## 2018-11-01 DIAGNOSIS — H524 Presbyopia: Secondary | ICD-10-CM | POA: Diagnosis not present

## 2018-11-01 DIAGNOSIS — H2513 Age-related nuclear cataract, bilateral: Secondary | ICD-10-CM | POA: Diagnosis not present

## 2018-11-30 ENCOUNTER — Other Ambulatory Visit: Payer: Self-pay | Admitting: Internal Medicine

## 2018-11-30 DIAGNOSIS — I700 Atherosclerosis of aorta: Secondary | ICD-10-CM | POA: Diagnosis not present

## 2018-11-30 DIAGNOSIS — Z13820 Encounter for screening for osteoporosis: Secondary | ICD-10-CM | POA: Diagnosis not present

## 2018-11-30 DIAGNOSIS — Z0001 Encounter for general adult medical examination with abnormal findings: Secondary | ICD-10-CM | POA: Diagnosis not present

## 2018-11-30 DIAGNOSIS — E7800 Pure hypercholesterolemia, unspecified: Secondary | ICD-10-CM | POA: Diagnosis not present

## 2018-11-30 DIAGNOSIS — I672 Cerebral atherosclerosis: Secondary | ICD-10-CM | POA: Diagnosis not present

## 2018-11-30 DIAGNOSIS — E559 Vitamin D deficiency, unspecified: Secondary | ICD-10-CM | POA: Diagnosis not present

## 2018-11-30 DIAGNOSIS — Z1389 Encounter for screening for other disorder: Secondary | ICD-10-CM | POA: Diagnosis not present

## 2018-11-30 DIAGNOSIS — F322 Major depressive disorder, single episode, severe without psychotic features: Secondary | ICD-10-CM | POA: Diagnosis not present

## 2018-11-30 DIAGNOSIS — Z79899 Other long term (current) drug therapy: Secondary | ICD-10-CM | POA: Diagnosis not present

## 2018-11-30 DIAGNOSIS — I10 Essential (primary) hypertension: Secondary | ICD-10-CM | POA: Diagnosis not present

## 2018-11-30 DIAGNOSIS — Z23 Encounter for immunization: Secondary | ICD-10-CM | POA: Diagnosis not present

## 2018-11-30 DIAGNOSIS — N289 Disorder of kidney and ureter, unspecified: Secondary | ICD-10-CM

## 2018-11-30 DIAGNOSIS — Z1239 Encounter for other screening for malignant neoplasm of breast: Secondary | ICD-10-CM | POA: Diagnosis not present

## 2018-12-05 ENCOUNTER — Ambulatory Visit
Admission: RE | Admit: 2018-12-05 | Discharge: 2018-12-05 | Disposition: A | Discharge status: Home / Self Care | Payer: Medicare Other | Source: Ambulatory Visit | Attending: Internal Medicine | Admitting: Internal Medicine

## 2018-12-05 DIAGNOSIS — N289 Disorder of kidney and ureter, unspecified: Secondary | ICD-10-CM

## 2018-12-05 DIAGNOSIS — N281 Cyst of kidney, acquired: Secondary | ICD-10-CM | POA: Diagnosis not present

## 2018-12-10 DIAGNOSIS — I10 Essential (primary) hypertension: Secondary | ICD-10-CM | POA: Diagnosis not present

## 2018-12-10 DIAGNOSIS — Z0001 Encounter for general adult medical examination with abnormal findings: Secondary | ICD-10-CM | POA: Diagnosis not present

## 2018-12-11 DIAGNOSIS — I10 Essential (primary) hypertension: Secondary | ICD-10-CM | POA: Diagnosis not present

## 2018-12-11 DIAGNOSIS — Z0001 Encounter for general adult medical examination with abnormal findings: Secondary | ICD-10-CM | POA: Diagnosis not present

## 2019-01-17 ENCOUNTER — Other Ambulatory Visit (HOSPITAL_COMMUNITY): Payer: Self-pay | Admitting: Neurosurgery

## 2019-01-17 ENCOUNTER — Other Ambulatory Visit: Payer: Self-pay | Admitting: Neurosurgery

## 2019-01-18 ENCOUNTER — Other Ambulatory Visit (HOSPITAL_COMMUNITY): Payer: Self-pay | Admitting: Neurosurgery

## 2019-01-18 DIAGNOSIS — I671 Cerebral aneurysm, nonruptured: Secondary | ICD-10-CM

## 2019-01-23 ENCOUNTER — Ambulatory Visit (HOSPITAL_COMMUNITY)
Admission: RE | Admit: 2019-01-23 | Discharge: 2019-01-23 | Disposition: A | Discharge status: Home / Self Care | Payer: Medicare Other | Source: Ambulatory Visit | Attending: Neurosurgery | Admitting: Neurosurgery

## 2019-01-23 ENCOUNTER — Other Ambulatory Visit: Payer: Self-pay

## 2019-01-23 ENCOUNTER — Encounter (HOSPITAL_COMMUNITY): Payer: Self-pay

## 2019-01-23 DIAGNOSIS — I671 Cerebral aneurysm, nonruptured: Secondary | ICD-10-CM

## 2019-01-30 ENCOUNTER — Ambulatory Visit (HOSPITAL_COMMUNITY)
Admission: RE | Admit: 2019-01-30 | Discharge: 2019-01-30 | Disposition: A | Discharge status: Home / Self Care | Payer: Medicare Other | Source: Ambulatory Visit | Attending: Neurosurgery | Admitting: Neurosurgery

## 2019-01-30 ENCOUNTER — Other Ambulatory Visit: Payer: Self-pay

## 2019-01-30 DIAGNOSIS — I671 Cerebral aneurysm, nonruptured: Secondary | ICD-10-CM | POA: Diagnosis not present

## 2019-01-30 LAB — POCT I-STAT CREATININE: Creatinine, Ser: 1.6 mg/dL — ABNORMAL HIGH (ref 0.44–1.00)

## 2019-01-30 MED ORDER — IOHEXOL 350 MG/ML SOLN
75.0000 mL | Freq: Once | INTRAVENOUS | Status: AC | PRN
  Administered 2019-01-30: 16:00:00 75 mL via INTRAVENOUS

## 2019-02-12 DIAGNOSIS — I671 Cerebral aneurysm, nonruptured: Secondary | ICD-10-CM | POA: Diagnosis not present

## 2019-07-07 DIAGNOSIS — Z20828 Contact with and (suspected) exposure to other viral communicable diseases: Secondary | ICD-10-CM | POA: Diagnosis not present

## 2019-09-11 ENCOUNTER — Other Ambulatory Visit: Payer: Self-pay | Admitting: Internal Medicine

## 2019-09-25 DIAGNOSIS — G500 Trigeminal neuralgia: Secondary | ICD-10-CM | POA: Diagnosis not present

## 2019-09-25 DIAGNOSIS — I10 Essential (primary) hypertension: Secondary | ICD-10-CM | POA: Diagnosis not present

## 2019-09-25 DIAGNOSIS — F322 Major depressive disorder, single episode, severe without psychotic features: Secondary | ICD-10-CM | POA: Diagnosis not present

## 2019-09-25 DIAGNOSIS — H43399 Other vitreous opacities, unspecified eye: Secondary | ICD-10-CM | POA: Diagnosis not present

## 2019-09-25 DIAGNOSIS — K219 Gastro-esophageal reflux disease without esophagitis: Secondary | ICD-10-CM | POA: Diagnosis not present

## 2019-09-25 DIAGNOSIS — H539 Unspecified visual disturbance: Secondary | ICD-10-CM | POA: Diagnosis not present

## 2019-09-25 DIAGNOSIS — C44309 Unspecified malignant neoplasm of skin of other parts of face: Secondary | ICD-10-CM | POA: Diagnosis not present

## 2019-11-14 ENCOUNTER — Telehealth: Payer: Self-pay | Admitting: Physician Assistant

## 2019-11-14 ENCOUNTER — Other Ambulatory Visit: Payer: Self-pay | Admitting: Physician Assistant

## 2019-11-14 DIAGNOSIS — U071 COVID-19: Secondary | ICD-10-CM

## 2019-11-14 DIAGNOSIS — I10 Essential (primary) hypertension: Secondary | ICD-10-CM

## 2019-11-14 NOTE — Telephone Encounter (Signed)
Called to discuss with patient about Covid symptoms and the use of casirivimab/imdevimab, a monoclonal antibody infusion for those with mild to moderate Covid symptoms and at a high risk of hospitalization.  Pt is qualified for this infusion at the [facility] due to; Specific high risk criteria : Older age (>/= 74 yo), HTN   Message left to call back our hotline [phone].  Angelena Form PA-C  [facility]

## 2019-11-14 NOTE — Progress Notes (Signed)
I connected by phone with [name] on 11/14/2019 at 8:22 PM to discuss the potential use of a new treatment for mild to moderate COVID-19 viral infection in non-hospitalized patients.  This patient is a 74 y.o. female that meets the FDA criteria for Emergency Use Authorization of COVID monoclonal antibody casirivimab/imdevimab.  Has a (+) direct SARS-CoV-2 viral test result  Has mild or moderate COVID-19   Is NOT hospitalized due to COVID-19  Is within 10 days of symptom onset  Has at least one of the high risk factor(s) for progression to severe COVID-19 and/or hospitalization as defined in EUA.  Specific high risk criteria : Older age (>/= 74 yo) and Cardiovascular disease or hypertension   I have spoken and communicated the following to the patient or parent/caregiver regarding COVID monoclonal antibody treatment:  1. FDA has authorized the emergency use for the treatment of mild to moderate COVID-19 in adults and pediatric patients with positive results of direct SARS-CoV-2 viral testing who are 57 years of age and older weighing at least 40 kg, and who are at high risk for progressing to severe COVID-19 and/or hospitalization.  2. The significant known and potential risks and benefits of COVID monoclonal antibody, and the extent to which such potential risks and benefits are unknown.  3. Information on available alternative treatments and the risks and benefits of those alternatives, including clinical trials.  4. Patients treated with COVID monoclonal antibody should continue to self-isolate and use infection contr 5. ol measures (e.g., wear mask, isolate, social distance, avoid sharing personal items, clean and disinfect "high touch" surfaces, and frequent handwashing) according to CDC guidelines.   6. The patient or parent/caregiver has the option to accept or refuse COVID monoclonal antibody treatment.  After reviewing this information with the patient, The patient agreed to  proceed with receiving casirivimab\imdevimab infusion and will be provided a copy of the Fact sheet prior to receiving the infusion.  Sx onset 8/14. Set up for infusion on 8/20 @ 12:30pm. Directions given to [facility]. Pt is aware that insurance will be charged an infusion fee. Pt is not vaccinated.   Angelena Form 11/14/2019 8:22 PM

## 2019-11-15 ENCOUNTER — Ambulatory Visit (HOSPITAL_COMMUNITY)
Admission: RE | Admit: 2019-11-15 | Discharge: 2019-11-15 | Disposition: A | Discharge status: Home / Self Care | Payer: Medicare Other | Source: Ambulatory Visit | Attending: Pulmonary Disease | Admitting: Pulmonary Disease

## 2019-11-15 DIAGNOSIS — U071 COVID-19: Secondary | ICD-10-CM | POA: Diagnosis not present

## 2019-11-15 DIAGNOSIS — Z23 Encounter for immunization: Secondary | ICD-10-CM | POA: Diagnosis not present

## 2019-11-15 DIAGNOSIS — I10 Essential (primary) hypertension: Secondary | ICD-10-CM | POA: Diagnosis not present

## 2019-11-15 MED ORDER — FAMOTIDINE IN NACL 20-0.9 MG/50ML-% IV SOLN: 20.0000 mg | Freq: Once | INTRAVENOUS | Status: DC | PRN

## 2019-11-15 MED ORDER — SODIUM CHLORIDE 0.9 % IV SOLN
1200.0000 mg | Freq: Once | INTRAVENOUS | Status: AC
  Administered 2019-11-15: 1200 mg via INTRAVENOUS
  Filled 2019-11-15: qty 10

## 2019-11-15 MED ORDER — DIPHENHYDRAMINE HCL 50 MG/ML IJ SOLN: 50.0000 mg | Freq: Once | INTRAMUSCULAR | Status: DC | PRN

## 2019-11-15 MED ORDER — SODIUM CHLORIDE 0.9 % IV SOLN: INTRAVENOUS | Status: DC | PRN

## 2019-11-15 MED ORDER — EPINEPHRINE 0.3 MG/0.3ML IJ SOAJ: 0.3000 mg | Freq: Once | INTRAMUSCULAR | Status: DC | PRN

## 2019-11-15 MED ORDER — METHYLPREDNISOLONE SODIUM SUCC 125 MG IJ SOLR: 125.0000 mg | Freq: Once | INTRAMUSCULAR | Status: DC | PRN

## 2019-11-15 MED ORDER — ALBUTEROL SULFATE HFA 108 (90 BASE) MCG/ACT IN AERS: 2.0000 | INHALATION_SPRAY | Freq: Once | RESPIRATORY_TRACT | Status: DC | PRN

## 2019-11-15 NOTE — Discharge Instructions (Signed)

## 2019-11-15 NOTE — Progress Notes (Signed)
  Diagnosis: COVID-19  Physician: Asencion Noble  Procedure: [facility] Med: casirivimab\imdevimab infusion - Provided patient with casirivimab\imdevimab fact sheet for patients, parents and caregivers prior to infusion.  Complications: No immediate complications noted.  Discharge: Discharged home   Heide Scales 11/15/2019

## 2019-11-22 ENCOUNTER — Other Ambulatory Visit: Payer: Self-pay

## 2019-11-22 ENCOUNTER — Inpatient Hospital Stay
Admission: EM | Admit: 2019-11-22 | Discharge: 2019-11-24 | DRG: 179 | Disposition: A | Discharge status: Home / Self Care | Payer: Medicare Other | Attending: Internal Medicine | Admitting: Internal Medicine

## 2019-11-22 ENCOUNTER — Observation Stay: Payer: Medicare Other

## 2019-11-22 ENCOUNTER — Encounter: Payer: Self-pay | Admitting: Emergency Medicine

## 2019-11-22 DIAGNOSIS — Z882 Allergy status to sulfonamides status: Secondary | ICD-10-CM

## 2019-11-22 DIAGNOSIS — I700 Atherosclerosis of aorta: Secondary | ICD-10-CM | POA: Diagnosis not present

## 2019-11-22 DIAGNOSIS — E860 Dehydration: Secondary | ICD-10-CM | POA: Diagnosis present

## 2019-11-22 DIAGNOSIS — Z885 Allergy status to narcotic agent status: Secondary | ICD-10-CM

## 2019-11-22 DIAGNOSIS — I10 Essential (primary) hypertension: Secondary | ICD-10-CM | POA: Diagnosis not present

## 2019-11-22 DIAGNOSIS — F40240 Claustrophobia: Secondary | ICD-10-CM | POA: Diagnosis not present

## 2019-11-22 DIAGNOSIS — D649 Anemia, unspecified: Secondary | ICD-10-CM | POA: Diagnosis present

## 2019-11-22 DIAGNOSIS — R197 Diarrhea, unspecified: Secondary | ICD-10-CM | POA: Diagnosis present

## 2019-11-22 DIAGNOSIS — Z9049 Acquired absence of other specified parts of digestive tract: Secondary | ICD-10-CM

## 2019-11-22 DIAGNOSIS — E876 Hypokalemia: Secondary | ICD-10-CM | POA: Diagnosis not present

## 2019-11-22 DIAGNOSIS — F329 Major depressive disorder, single episode, unspecified: Secondary | ICD-10-CM | POA: Diagnosis not present

## 2019-11-22 DIAGNOSIS — R112 Nausea with vomiting, unspecified: Secondary | ICD-10-CM | POA: Diagnosis not present

## 2019-11-22 DIAGNOSIS — U071 COVID-19: Secondary | ICD-10-CM | POA: Diagnosis not present

## 2019-11-22 DIAGNOSIS — R5383 Other fatigue: Secondary | ICD-10-CM | POA: Diagnosis not present

## 2019-11-22 DIAGNOSIS — Z79899 Other long term (current) drug therapy: Secondary | ICD-10-CM

## 2019-11-22 DIAGNOSIS — F32A Depression, unspecified: Secondary | ICD-10-CM | POA: Diagnosis present

## 2019-11-22 DIAGNOSIS — R109 Unspecified abdominal pain: Secondary | ICD-10-CM | POA: Diagnosis present

## 2019-11-22 DIAGNOSIS — Z91041 Radiographic dye allergy status: Secondary | ICD-10-CM

## 2019-11-22 DIAGNOSIS — R531 Weakness: Secondary | ICD-10-CM | POA: Diagnosis not present

## 2019-11-22 DIAGNOSIS — R918 Other nonspecific abnormal finding of lung field: Secondary | ICD-10-CM | POA: Diagnosis not present

## 2019-11-22 LAB — CBC
HCT: 29.4 % — ABNORMAL LOW (ref 36.0–46.0)
Hemoglobin: 10.8 g/dL — ABNORMAL LOW (ref 12.0–15.0)
MCH: 30.3 pg (ref 26.0–34.0)
MCHC: 36.7 g/dL — ABNORMAL HIGH (ref 30.0–36.0)
MCV: 82.4 fL (ref 80.0–100.0)
Platelets: 432 10*3/uL — ABNORMAL HIGH (ref 150–400)
RBC: 3.57 MIL/uL — ABNORMAL LOW (ref 3.87–5.11)
RDW: 12.4 % (ref 11.5–15.5)
WBC: 7.8 10*3/uL (ref 4.0–10.5)
nRBC: 0 % (ref 0.0–0.2)

## 2019-11-22 LAB — BASIC METABOLIC PANEL
Anion gap: 14 (ref 5–15)
Anion gap: 11 (ref 5–15)
BUN: 19 mg/dL (ref 8–23)
BUN: 16 mg/dL (ref 8–23)
CO2: 23 mmol/L (ref 22–32)
CO2: 23 mmol/L (ref 22–32)
Calcium: 9.6 mg/dL (ref 8.9–10.3)
Calcium: 9 mg/dL (ref 8.9–10.3)
Chloride: 99 mmol/L (ref 98–111)
Chloride: 103 mmol/L (ref 98–111)
Creatinine, Ser: 1.05 mg/dL — ABNORMAL HIGH (ref 0.44–1.00)
Creatinine, Ser: 1.05 mg/dL — ABNORMAL HIGH (ref 0.44–1.00)
GFR calc Af Amer: >60 mL/min (ref >60)
GFR calc Af Amer: >60 mL/min (ref >60)
GFR calc non Af Amer: 52 mL/min — ABNORMAL LOW (ref >60)
GFR calc non Af Amer: 52 mL/min — ABNORMAL LOW (ref >60)
Glucose, Bld: 134 mg/dL — ABNORMAL HIGH (ref 70–99)
Glucose, Bld: 175 mg/dL — ABNORMAL HIGH (ref 70–99)
Potassium: 2.3 mmol/L — CL (ref 3.5–5.1)
Potassium: 2.7 mmol/L — CL (ref 3.5–5.1)
Sodium: 136 mmol/L (ref 135–145)
Sodium: 137 mmol/L (ref 135–145)

## 2019-11-22 LAB — CBC WITH DIFFERENTIAL/PLATELET
Abs Immature Granulocytes: 0.08 10*3/uL — ABNORMAL HIGH (ref 0.00–0.07)
Basophils Absolute: 0 10*3/uL (ref 0.0–0.1)
Basophils Relative: 0 %
Eosinophils Absolute: 0.2 10*3/uL (ref 0.0–0.5)
Eosinophils Relative: 2 %
HCT: 26.3 % — ABNORMAL LOW (ref 36.0–46.0)
Hemoglobin: 9.7 g/dL — ABNORMAL LOW (ref 12.0–15.0)
Immature Granulocytes: 1 %
Lymphocytes Relative: 17 %
Lymphs Abs: 1.5 10*3/uL (ref 0.7–4.0)
MCH: 30.2 pg (ref 26.0–34.0)
MCHC: 36.9 g/dL — ABNORMAL HIGH (ref 30.0–36.0)
MCV: 81.9 fL (ref 80.0–100.0)
Monocytes Absolute: 1.1 10*3/uL — ABNORMAL HIGH (ref 0.1–1.0)
Monocytes Relative: 13 %
Neutro Abs: 6 10*3/uL (ref 1.7–7.7)
Neutrophils Relative %: 67 %
Platelets: 394 10*3/uL (ref 150–400)
RBC: 3.21 MIL/uL — ABNORMAL LOW (ref 3.87–5.11)
RDW: 12.2 % (ref 11.5–15.5)
WBC: 8.9 10*3/uL (ref 4.0–10.5)
nRBC: 0 % (ref 0.0–0.2)

## 2019-11-22 LAB — URINALYSIS, COMPLETE (UACMP) WITH MICROSCOPIC
Bacteria, UA: NONE SEEN
Bilirubin Urine: NEGATIVE
Glucose, UA: NEGATIVE mg/dL
Hgb urine dipstick: NEGATIVE
Ketones, ur: NEGATIVE mg/dL
Leukocytes,Ua: NEGATIVE
Nitrite: NEGATIVE
Protein, ur: NEGATIVE mg/dL
Specific Gravity, Urine: 1.009 (ref 1.005–1.030)
pH: 7 (ref 5.0–8.0)

## 2019-11-22 LAB — MAGNESIUM: Magnesium: 1.8 mg/dL (ref 1.7–2.4)

## 2019-11-22 LAB — ABO/RH: ABO/RH(D): B POS

## 2019-11-22 LAB — TROPONIN I (HIGH SENSITIVITY): Troponin I (High Sensitivity): 6 ng/L (ref <18)

## 2019-11-22 MED ORDER — POTASSIUM CHLORIDE 10 MEQ/100ML IV SOLN
10.0000 meq | INTRAVENOUS | Status: AC
  Administered 2019-11-22: 10 meq via INTRAVENOUS
  Filled 2019-11-22: qty 100

## 2019-11-22 MED ORDER — POTASSIUM CHLORIDE 10 MEQ/100ML IV SOLN
10.0000 meq | INTRAVENOUS | Status: AC
  Administered 2019-11-23 (×4): 10 meq via INTRAVENOUS
  Filled 2019-11-22: qty 100

## 2019-11-22 MED ORDER — LACTATED RINGERS IV SOLN: INTRAVENOUS | Status: DC

## 2019-11-22 MED ORDER — ONDANSETRON HCL 4 MG PO TABS: 4.0000 mg | ORAL_TABLET | Freq: Four times a day (QID) | ORAL | Status: DC | PRN

## 2019-11-22 MED ORDER — CITALOPRAM HYDROBROMIDE 20 MG PO TABS
40.0000 mg | ORAL_TABLET | Freq: Every day | ORAL | Status: DC
  Administered 2019-11-23 – 2019-11-24 (×2): 40 mg via ORAL
  Filled 2019-11-22 (×2): qty 2

## 2019-11-22 MED ORDER — ACETAMINOPHEN 325 MG PO TABS
650.0000 mg | ORAL_TABLET | Freq: Four times a day (QID) | ORAL | Status: DC | PRN
  Administered 2019-11-23 (×2): 650 mg via ORAL
  Filled 2019-11-22 (×2): qty 2

## 2019-11-22 MED ORDER — ONDANSETRON HCL 4 MG/2ML IJ SOLN
INTRAMUSCULAR | Status: AC
  Administered 2019-11-22: 4 mg
  Filled 2019-11-22: qty 2

## 2019-11-22 MED ORDER — ENOXAPARIN SODIUM 40 MG/0.4ML ~~LOC~~ SOLN: 40.0000 mg | SUBCUTANEOUS | Status: DC

## 2019-11-22 MED ORDER — LACTATED RINGERS IV BOLUS
1000.0000 mL | Freq: Once | INTRAVENOUS | Status: AC
  Administered 2019-11-22: 1000 mL via INTRAVENOUS

## 2019-11-22 MED ORDER — MAGNESIUM SULFATE 2 GM/50ML IV SOLN
2.0000 g | Freq: Once | INTRAVENOUS | Status: AC
  Administered 2019-11-22: 2 g via INTRAVENOUS
  Filled 2019-11-22: qty 50

## 2019-11-22 MED ORDER — KCL IN DEXTROSE-NACL 20-5-0.45 MEQ/L-%-% IV SOLN
INTRAVENOUS | Status: DC
  Filled 2019-11-22 (×7): qty 1000

## 2019-11-22 MED ORDER — MORPHINE SULFATE (PF) 2 MG/ML IV SOLN: 2.0000 mg | INTRAVENOUS | Status: DC | PRN

## 2019-11-22 MED ORDER — ONDANSETRON HCL 4 MG/2ML IJ SOLN
4.0000 mg | Freq: Four times a day (QID) | INTRAMUSCULAR | Status: DC | PRN
  Administered 2019-11-23: 23:00:00 4 mg via INTRAVENOUS
  Filled 2019-11-22: qty 2

## 2019-11-22 MED ORDER — POTASSIUM CHLORIDE CRYS ER 20 MEQ PO TBCR
40.0000 meq | EXTENDED_RELEASE_TABLET | Freq: Once | ORAL | Status: AC
  Administered 2019-11-23: 40 meq via ORAL
  Filled 2019-11-22: qty 2

## 2019-11-22 MED ORDER — AMLODIPINE BESYLATE 5 MG PO TABS
5.0000 mg | ORAL_TABLET | Freq: Every day | ORAL | Status: DC
  Administered 2019-11-22 – 2019-11-24 (×3): 5 mg via ORAL
  Filled 2019-11-22 (×3): qty 1

## 2019-11-22 MED ORDER — ACETAMINOPHEN 650 MG RE SUPP: 650.0000 mg | Freq: Four times a day (QID) | RECTAL | Status: DC | PRN

## 2019-11-22 MED ORDER — POTASSIUM CHLORIDE 10 MEQ/100ML IV SOLN
10.0000 meq | INTRAVENOUS | Status: AC
  Administered 2019-11-22 (×2): 10 meq via INTRAVENOUS
  Filled 2019-11-22: qty 100

## 2019-11-22 MED ORDER — CARBAMAZEPINE 200 MG PO TABS
200.0000 mg | ORAL_TABLET | Freq: Every day | ORAL | Status: DC
  Administered 2019-11-23 – 2019-11-24 (×2): 200 mg via ORAL
  Filled 2019-11-22 (×3): qty 1

## 2019-11-22 MED ORDER — POTASSIUM CHLORIDE CRYS ER 20 MEQ PO TBCR
40.0000 meq | EXTENDED_RELEASE_TABLET | Freq: Once | ORAL | Status: AC
  Administered 2019-11-22: 40 meq via ORAL
  Filled 2019-11-22: qty 2

## 2019-11-22 MED ORDER — ZINC SULFATE 220 (50 ZN) MG PO CAPS
220.0000 mg | ORAL_CAPSULE | Freq: Every day | ORAL | Status: DC
  Administered 2019-11-22 – 2019-11-23 (×2): 220 mg via ORAL
  Filled 2019-11-22 (×2): qty 1

## 2019-11-22 MED ORDER — PANTOPRAZOLE SODIUM 40 MG IV SOLR
40.0000 mg | Freq: Two times a day (BID) | INTRAVENOUS | Status: DC
  Administered 2019-11-22 – 2019-11-23 (×2): 40 mg via INTRAVENOUS
  Filled 2019-11-22 (×2): qty 40

## 2019-11-22 MED ORDER — ASCORBIC ACID 500 MG PO TABS
500.0000 mg | ORAL_TABLET | Freq: Every day | ORAL | Status: DC
  Administered 2019-11-22 – 2019-11-23 (×2): 500 mg via ORAL
  Filled 2019-11-22 (×2): qty 1

## 2019-11-22 NOTE — Progress Notes (Signed)
Pt arrived to unit via stretcher. Pt assisted to [facility] with stand by assist. Unsteady gait noted.

## 2019-11-22 NOTE — ED Notes (Signed)
Pt had episode of emesis at this time.

## 2019-11-22 NOTE — ED Provider Notes (Signed)
[facility] Emergency Department Provider Note  ____________________________________________   First MD Initiated Contact with Patient 11/22/19 1546     (approximate)  I have reviewed the triage vital signs and the nursing notes.   HISTORY  Chief Complaint Fatigue, Nausea, and Emesis    HPI [name] is a 74 y.o. female  With PMHx HTN here with fatigue,weakness. Pt was recently diagnosed with COVID and underwent antibody infusion. She reports that for thel ast 3-4 days, she has been essentially unable to keep any food or water down. She's had associated weakness and difficulty getting around the house. Over the last 12 hours, she has essentially been unable to walk or hold her head up 2/2 weakness. She denies any significant abd pain, only soreness when vomiting. She has had diarrhea as well. No cough, SOB. No chest pain. No fever, chills. No urinary symptoms.       Past Medical History:  Diagnosis Date  . Hypertension     Patient Active Problem List   Diagnosis Date Noted  . Hypokalemia 11/22/2019    History reviewed. No pertinent surgical history.  Prior to Admission medications   Medication Sig Start Date End Date Taking? Authorizing Provider  acetaminophen (TYLENOL) 500 MG tablet Take 1 tablet (500 mg total) by mouth 4 (four) times daily. Do not take if you have had an oxycodone/acetaminophen tablet within 4 hours. 09/21/16  Yes Mesner, Corene Cornea, MD  amLODipine (NORVASC) 5 MG tablet Take 5 mg by mouth daily.  08/14/19  Yes [provider]  carbamazepine (TEGRETOL) 200 MG tablet Take 200 mg by mouth daily.  09/13/19  Yes [provider]  citalopram (CELEXA) 40 MG tablet Take 40 mg by mouth in the morning and at bedtime.  08/14/19  Yes [provider]  ibuprofen (ADVIL,MOTRIN) 400 MG tablet Take 1 tablet (400 mg total) by mouth 4 (four) times daily. 09/21/16  Yes Mesner, Corene Cornea, MD  losartan-hydrochlorothiazide [facility]) 100-25  MG tablet  09/11/19  Yes [provider]    Allergies Codone [hydrocodone], Iohexol, and Sulfa antibiotics  No family history on file.  Social History Social History   Tobacco Use  . Smoking status: Never Smoker  . Smokeless tobacco: Never Used  Substance Use Topics  . Alcohol use: No  . Drug use: No    Review of Systems  Review of Systems  Constitutional: Positive for fatigue. Negative for fever.  HENT: Negative for congestion and sore throat.   Eyes: Negative for visual disturbance.  Respiratory: Negative for cough and shortness of breath.   Cardiovascular: Negative for chest pain.  Gastrointestinal: Positive for nausea and vomiting. Negative for abdominal pain and diarrhea.  Genitourinary: Negative for flank pain.  Musculoskeletal: Negative for back pain and neck pain.  Skin: Negative for rash and wound.  Neurological: Positive for weakness and light-headedness.  All other systems reviewed and are negative.    ____________________________________________  PHYSICAL EXAM:      VITAL SIGNS: ED Triage Vitals [11/22/19 1457]  Enc Vitals Group     BP 140/75     Pulse Rate 74     Resp 16     Temp 98.2 F (36.8 C)     Temp Source Oral     SpO2 98 %     Weight 115 lb (52.2 kg)     Height 5\' 3"  (1.6 m)     Head Circumference      Peak Flow      Pain  Score 0     Pain Loc      Pain Edu?      Excl. in [place]?      Physical Exam Vitals and nursing note reviewed.  Constitutional:      General: She is not in acute distress.    Appearance: She is well-developed.  HENT:     Head: Normocephalic and atraumatic.     Mouth/Throat:     Mouth: Mucous membranes are dry.  Eyes:     Conjunctiva/sclera: Conjunctivae normal.  Cardiovascular:     Rate and Rhythm: Normal rate and regular rhythm.     Heart sounds: Normal heart sounds. No murmur heard.  No friction rub.  Pulmonary:     Effort: Pulmonary effort is normal. No respiratory distress.     Breath sounds:  Normal breath sounds. No wheezing or rales.  Abdominal:     General: There is no distension.     Palpations: Abdomen is soft.     Tenderness: There is no abdominal tenderness.  Musculoskeletal:     Cervical back: Neck supple.  Skin:    General: Skin is warm.     Capillary Refill: Capillary refill takes less than 2 seconds.  Neurological:     Mental Status: She is alert and oriented to person, place, and time.     Motor: No abnormal muscle tone.       ____________________________________________   LABS (all labs ordered are listed, but only abnormal results are displayed)  Labs Reviewed  BASIC METABOLIC PANEL - Abnormal; Notable for the following components:      Result Value   Potassium 2.3 (*)    Glucose, Bld 134 (*)    Creatinine, Ser 1.05 (*)    GFR calc non Af Amer 52 (*)    All other components within normal limits  CBC - Abnormal; Notable for the following components:   RBC 3.57 (*)    Hemoglobin 10.8 (*)    HCT 29.4 (*)    MCHC 36.7 (*)    Platelets 432 (*)    All other components within normal limits  MAGNESIUM  URINALYSIS, COMPLETE (UACMP) WITH MICROSCOPIC  TROPONIN I (HIGH SENSITIVITY)    ____________________________________________  EKG: Accelerated junctional rhythm, VR 91. QRS 76, QTc 467. Non specific ST changes in I, II, aVL, lateral precordial leads. No ST elevations. ________________________________________  RADIOLOGY All imaging, including plain films, CT scans, and ultrasounds, independently reviewed by me, and interpretations confirmed via formal radiology reads.  ED MD interpretation:   None  Official radiology report(s): No results found.  ____________________________________________  PROCEDURES   Procedure(s) performed (including Critical Care):  Procedures  ____________________________________________  INITIAL IMPRESSION / MDM / [place] / ED COURSE  As part of my medical decision making, I reviewed the following  data within the [place] notes reviewed and incorporated, Old chart reviewed, Notes from prior ED visits, and [place] Controlled Substance Database       *Donzella Carrol Ozturk was evaluated in Emergency Department on 11/22/2019 for the symptoms described in the history of present illness. She was evaluated in the context of the global COVID-19 pandemic, which necessitated consideration that the patient might be at risk for infection with the SARS-CoV-2 virus that causes COVID-19. Institutional protocols and algorithms that pertain to the evaluation of patients at risk for COVID-19 are in a state of rapid change based on information released by regulatory bodies including the CDC and federal and state organizations. These  policies and algorithms were followed during the patient's care in the ED.  Some ED evaluations and interventions may be delayed as a result of limited staffing during the pandemic.*     Medical Decision Making:  74 yo F here with generalized weakness, nausea, vomiting 2/2 COVID-19. Pt weak, unable to ambulate independently 2/2 her dehydration and profound hypokalemia likely due to her poor PO intake. Not hypoxic and lungs are CTAB. Abdomen soft, NT, without focal TTP to suggest intra-abd pathology, will hold on Ct at this time. Will check plain films for obstruction, admit to medicine. IV K, Mag ordered.  ____________________________________________  FINAL CLINICAL IMPRESSION(S) / ED DIAGNOSES  Final diagnoses:  Intractable nausea and vomiting  Dehydration  Hypokalemia     MEDICATIONS GIVEN DURING THIS VISIT:  Medications  potassium chloride 10 mEq in 100 mL IVPB (10 mEq Intravenous New Bag/Given 11/22/19 1621)  lactated ringers infusion ( Intravenous New Bag/Given 11/22/19 1655)  lactated ringers bolus 1,000 mL (1,000 mLs Intravenous New Bag/Given 11/22/19 1557)  potassium chloride SA (KLOR-CON) CR tablet 40 mEq (40 mEq Oral Given 11/22/19 1558)  magnesium  sulfate IVPB 2 g 50 mL (0 g Intravenous Stopped 11/22/19 1706)  ondansetron (ZOFRAN) 4 MG/2ML injection (4 mg  Given 11/22/19 1617)     ED Discharge Orders    None       Note:  This document was prepared using Dragon voice recognition software and may include unintentional dictation errors.   Duffy Bruce, MD 11/22/19 [phone]

## 2019-11-22 NOTE — ED Triage Notes (Signed)
Here via EMS, pt states she was diagnosed with covid on 11/11/19. Received antibody infusion on 8/20, here now with c/o extreme fatigue, "unable to hold her head up." Pt was sleeping in covid area of lobby and had not heard her name called. Denies pain. NAD.

## 2019-11-22 NOTE — Progress Notes (Addendum)
CRITICAL VALUE ALERT  Critical Value: K+ 2.7  Date & Time Notied:  2330 11/22/2019  Provider Notified: Rachael Fee., NP  Orders Received/Actions taken: Currently treating w/ new orders placed

## 2019-11-22 NOTE — ED Triage Notes (Signed)
covid + on 14th.  Did antibody infusion.  Now with dizziness weakness.  vss t 97.6.  A&O.  Has not been eating as well.   daughter in law amy Daft [phone].

## 2019-11-22 NOTE — H&P (Signed)
[facility] History and Physical   Patient: [name] TKW:[id number]   PCP: Josetta Huddle, MD DOB: November 04, 1945   DOA: 11/22/2019   DOS: 11/22/2019   DOS: the patient was seen and examined on 11/22/2019  Patient coming from: The patient is coming from Home  Chief Complaint: Fatigue and tiredness  HPI: [name] is a 74 y.o. female with Past medical history of essential hypertension, depression. Patient presented with complaints of fatigue and tiredness. Patient was recently diagnosed with COVID-19 virus infection on 11/09/2019. She had an exposure with a family member prior to this. She had some fever she had some fatigue and tiredness after this event. She also had some cough and shortness of breath. The symptoms resolved on their own. She went for an monoclonal antibody infusion on 11/15/2019. After this she started having more fatigue and more tiredness. She also had complaints of diarrhea. She reported having a bowel movement every 30 minutes which was liquidy and watery.  Without any blood but reported occasionally black-colored bowel movement. She did not have any abdominal pain with this. She started having nausea today and was unable to keep anything down and therefore decided to come to the ER. On arrival to ER she started having vomiting.  Patient also reported some headache at the time of my evaluation. No focal deficit.  No chest pain.  Nausea seems to have resolved. Patient reports she is claustrophobic in the ER room.  ED Course: Found to have potassium of 2.3.  Started on replacement and IV as patient was not able to keep anything down p.o.  Referred for admission.  At her baseline ambulates without assistance independent for most of her ADL;  manages her medication on her own.  Review of Systems: as mentioned in the history of present illness.  All other systems reviewed and are negative.  Past Medical History:  Diagnosis Date  . Hypertension    History  reviewed. No pertinent surgical history. Social History:  reports that she has never smoked. She has never used smokeless tobacco. She reports that she does not drink alcohol and does not use drugs.  Allergies  Allergen Reactions  . Codone [Hydrocodone] Itching and Nausea And Vomiting  . Iohexol Other (See Comments)     Desc: omnipaque 300-pt states she has nausea and vomiting and SOB, pt premedicated w/prednisone and benadryl and ok   . Sulfa Antibiotics Nausea And Vomiting   Family history of positive Covid  Prior to Admission medications   Medication Sig Start Date End Date Taking? Authorizing Provider  acetaminophen (TYLENOL) 500 MG tablet Take 1 tablet (500 mg total) by mouth 4 (four) times daily. Do not take if you have had an oxycodone/acetaminophen tablet within 4 hours. 09/21/16  Yes Mesner, Corene Cornea, MD  amLODipine (NORVASC) 5 MG tablet Take 5 mg by mouth daily.  08/14/19  Yes [provider]  carbamazepine (TEGRETOL) 200 MG tablet Take 200 mg by mouth daily.  09/13/19  Yes [provider]  citalopram (CELEXA) 40 MG tablet Take 40 mg by mouth in the morning and at bedtime.  08/14/19  Yes [provider]  ibuprofen (ADVIL,MOTRIN) 400 MG tablet Take 1 tablet (400 mg total) by mouth 4 (four) times daily. 09/21/16  Yes Mesner, Corene Cornea, MD  losartan-hydrochlorothiazide [facility]) 100-25 MG tablet  09/11/19  Yes [provider]    Physical Exam: Vitals:   11/22/19 1800 11/22/19 1830 11/22/19 1845 11/22/19 1847  BP: (!) 162/82 (!) 194/67  Pulse: 65 64  61  Resp:   17 17  Temp:      TempSrc:      SpO2: 100% 100%  100%  Weight:      Height:        General: alert and oriented to time, place, and person. Appear in moderate distress, affect anxious Eyes: PERRL, Conjunctiva normal ENT: Oral Mucosa Clear, moist  Neck: no JVD, no Abnormal Mass Or lumps Cardiovascular: S1 and S2 Present, no Murmur, peripheral pulses symmetrical Respiratory: good respiratory  effort, Bilateral Air entry equal and Decreased, no signs of accessory muscle use, faint basal Crackles, no wheezes Abdomen: Bowel Sound present, Soft and mild tenderness, no hernia Skin: no rashes  Extremities: no Pedal edema, no calf tenderness Neurologic: without any new focal findings Gait not checked due to patient safety concerns  Data Reviewed: I have personally reviewed and interpreted labs, imaging as discussed below.  CBC: Recent Labs  Lab 11/22/19 1506  WBC 7.8  HGB 10.8*  HCT 29.4*  MCV 82.4  PLT 169*   Basic Metabolic Panel: Recent Labs  Lab 11/22/19 1506  NA 136  K 2.3*  CL 99  CO2 23  GLUCOSE 134*  BUN 19  CREATININE 1.05*  CALCIUM 9.6  MG 1.8   GFR: Estimated Creatinine Clearance: 37.7 mL/min (A) (by C-G formula based on SCr of 1.05 mg/dL (H)). Liver Function Tests: No results for input(s): AST, ALT, ALKPHOS, BILITOT, PROT, ALBUMIN in the last 168 hours. No results for input(s): LIPASE, AMYLASE in the last 168 hours. No results for input(s): AMMONIA in the last 168 hours. Coagulation Profile: No results for input(s): INR, PROTIME in the last 168 hours. Cardiac Enzymes: No results for input(s): CKTOTAL, CKMB, CKMBINDEX, TROPONINI in the last 168 hours. BNP (last 3 results) No results for input(s): PROBNP in the last 8760 hours. HbA1C: No results for input(s): HGBA1C in the last 72 hours. CBG: No results for input(s): GLUCAP in the last 168 hours. Lipid Profile: No results for input(s): CHOL, HDL, LDLCALC, TRIG, CHOLHDL, LDLDIRECT in the last 72 hours. Thyroid Function Tests: No results for input(s): TSH, T4TOTAL, FREET4, T3FREE, THYROIDAB in the last 72 hours. Anemia Panel: No results for input(s): VITAMINB12, FOLATE, FERRITIN, TIBC, IRON, RETICCTPCT in the last 72 hours. Urine analysis:    Component Value Date/Time   COLORURINE YELLOW (A) 11/22/2019 1656   APPEARANCEUR HAZY (A) 11/22/2019 1656   LABSPEC 1.009 11/22/2019 1656   PHURINE 7.0  11/22/2019 1656   GLUCOSEU NEGATIVE 11/22/2019 1656   HGBUR NEGATIVE 11/22/2019 1656   BILIRUBINUR NEGATIVE 11/22/2019 1656   KETONESUR NEGATIVE 11/22/2019 1656   PROTEINUR NEGATIVE 11/22/2019 1656   NITRITE NEGATIVE 11/22/2019 1656   LEUKOCYTESUR NEGATIVE 11/22/2019 1656    Radiological Exams on Admission: DG Abdomen Acute W/Chest  Result Date: 11/22/2019 CLINICAL DATA:  COVID-19 positivity with fatigue and cough EXAM: DG ABDOMEN ACUTE W/ 1V CHEST COMPARISON:  01/30/2019 FINDINGS: Cardiac shadow is within normal limits. The lungs are well aerated bilaterally. Patchy airspace opacities are noted bilaterally consistent with the given clinical history. Aortic calcifications are noted. Scattered large and small bowel gas is noted. Postsurgical changes consistent with prior cholecystectomy are again noted. No free air is seen. No abnormal mass or abnormal calcifications are noted. No bony abnormality is seen. IMPRESSION: Scattered parenchymal opacities within the lungs consistent with the given clinical history of COVID-19 positivity. Unremarkable abdomen. Electronically Signed   By: Inez Catalina M.D.   On: 11/22/2019 17:38  EKG: Independently reviewed. normal sinus rhythm, nonspecific ST and T waves changes.  I reviewed all nursing notes, pharmacy notes, vitals, pertinent old records.  Assessment/Plan 1. Hypokalemia In the setting of nausea vomiting as well as diarrhea. Currently replacing aggressively with IV. We will recheck it. Check magnesium. IV fluids with D5 half-normal saline and potassium.  2.  Diarrhea. Etiology not clear likely in the setting of COVID-19 illness. Worsened likely in the setting of Covid antibodies. We will rule out C. difficile. X-ray abdomen negative for any acute abnormality.  3.  COVID-19 viral infection. Has symptoms few days ago. Currently not having any shortness of breath or hypoxia. Chest x-ray positive for any acute infiltrate Has not received  remdesivir. Diagnosis more than 13 days ago therefore do not think in the remdesivir to play any role at this point. Also received COVID-19 antibodies outpatient. Currently no indication for steroids given lack of hypoxia. Continue to monitor.  4.  Depression. Mood disorder. Continue Tegretol and Celexa.  5.  Essential hypertension. Continue Norvasc.  6.  Anemia. Patient reported black-colored bowel movement No BM since yesterday. Hemoglobin dropped from 12.8-10.8. We will continue SCD. No pharmacological DVT prophylaxis. Monitor H&H. FOBT.  Nutrition: Full liquid diet DVT Prophylaxis: SCD, pharmacological prophylaxis contraindicated due to Anemia and concern for bleeding  Advance goals of care discussion: Full code   Consults: none   Family Communication: no family was present at bedside, at the time of interview.    Author: Berle Mull, MD [facility] 11/22/2019 7:23 PM   To reach On-call, see care teams to locate the attending and reach out to them via [email or web address]. If 7PM-7AM, please contact night-coverage If you still have difficulty reaching the attending provider, please page the [facility] (Director on Call) for [facility] on amion for assistance.

## 2019-11-23 DIAGNOSIS — E876 Hypokalemia: Secondary | ICD-10-CM | POA: Diagnosis present

## 2019-11-23 DIAGNOSIS — Z91041 Radiographic dye allergy status: Secondary | ICD-10-CM | POA: Diagnosis not present

## 2019-11-23 DIAGNOSIS — I10 Essential (primary) hypertension: Secondary | ICD-10-CM | POA: Diagnosis present

## 2019-11-23 DIAGNOSIS — Z9049 Acquired absence of other specified parts of digestive tract: Secondary | ICD-10-CM | POA: Diagnosis not present

## 2019-11-23 DIAGNOSIS — F40240 Claustrophobia: Secondary | ICD-10-CM | POA: Diagnosis present

## 2019-11-23 DIAGNOSIS — U071 COVID-19: Secondary | ICD-10-CM | POA: Diagnosis present

## 2019-11-23 DIAGNOSIS — R197 Diarrhea, unspecified: Secondary | ICD-10-CM | POA: Diagnosis present

## 2019-11-23 DIAGNOSIS — F329 Major depressive disorder, single episode, unspecified: Secondary | ICD-10-CM | POA: Diagnosis present

## 2019-11-23 DIAGNOSIS — D649 Anemia, unspecified: Secondary | ICD-10-CM | POA: Diagnosis present

## 2019-11-23 DIAGNOSIS — Z885 Allergy status to narcotic agent status: Secondary | ICD-10-CM | POA: Diagnosis not present

## 2019-11-23 DIAGNOSIS — E860 Dehydration: Secondary | ICD-10-CM | POA: Diagnosis present

## 2019-11-23 DIAGNOSIS — Z882 Allergy status to sulfonamides status: Secondary | ICD-10-CM | POA: Diagnosis not present

## 2019-11-23 DIAGNOSIS — Z79899 Other long term (current) drug therapy: Secondary | ICD-10-CM | POA: Diagnosis not present

## 2019-11-23 LAB — CBC WITH DIFFERENTIAL/PLATELET
Abs Immature Granulocytes: 0.04 10*3/uL (ref 0.00–0.07)
Basophils Absolute: 0 10*3/uL (ref 0.0–0.1)
Basophils Relative: 0 %
Eosinophils Absolute: 0.2 10*3/uL (ref 0.0–0.5)
Eosinophils Relative: 3 %
HCT: 25.1 % — ABNORMAL LOW (ref 36.0–46.0)
Hemoglobin: 9 g/dL — ABNORMAL LOW (ref 12.0–15.0)
Immature Granulocytes: 1 %
Lymphocytes Relative: 26 %
Lymphs Abs: 1.6 10*3/uL (ref 0.7–4.0)
MCH: 31 pg (ref 26.0–34.0)
MCHC: 35.9 g/dL (ref 30.0–36.0)
MCV: 86.6 fL (ref 80.0–100.0)
Monocytes Absolute: 0.8 10*3/uL (ref 0.1–1.0)
Monocytes Relative: 13 %
Neutro Abs: 3.5 10*3/uL (ref 1.7–7.7)
Neutrophils Relative %: 57 %
Platelets: 367 10*3/uL (ref 150–400)
RBC: 2.9 MIL/uL — ABNORMAL LOW (ref 3.87–5.11)
RDW: 12.4 % (ref 11.5–15.5)
WBC: 6.2 10*3/uL (ref 4.0–10.5)
nRBC: 0 % (ref 0.0–0.2)

## 2019-11-23 LAB — COMPREHENSIVE METABOLIC PANEL
ALT: 36 U/L (ref 0–44)
AST: 33 U/L (ref 15–41)
Albumin: 3 g/dL — ABNORMAL LOW (ref 3.5–5.0)
Alkaline Phosphatase: 63 U/L (ref 38–126)
Anion gap: 6 (ref 5–15)
BUN: 14 mg/dL (ref 8–23)
CO2: 25 mmol/L (ref 22–32)
Calcium: 8.8 mg/dL — ABNORMAL LOW (ref 8.9–10.3)
Chloride: 107 mmol/L (ref 98–111)
Creatinine, Ser: 1.02 mg/dL — ABNORMAL HIGH (ref 0.44–1.00)
GFR calc Af Amer: >60 mL/min (ref >60)
GFR calc non Af Amer: 54 mL/min — ABNORMAL LOW (ref >60)
Glucose, Bld: 117 mg/dL — ABNORMAL HIGH (ref 70–99)
Potassium: 3.6 mmol/L (ref 3.5–5.1)
Sodium: 138 mmol/L (ref 135–145)
Total Bilirubin: 0.7 mg/dL (ref 0.3–1.2)
Total Protein: 6.1 g/dL — ABNORMAL LOW (ref 6.5–8.1)

## 2019-11-23 LAB — C DIFFICILE QUICK SCREEN W PCR REFLEX
C Diff antigen: NEGATIVE
C Diff interpretation: NOT DETECTED
C Diff toxin: NEGATIVE

## 2019-11-23 LAB — FIBRIN DERIVATIVES D-DIMER (ARMC ONLY): Fibrin derivatives D-dimer (ARMC): 959.6 ng/mL (FEU) — ABNORMAL HIGH (ref 0.00–499.00)

## 2019-11-23 LAB — C-REACTIVE PROTEIN: CRP: 4.5 mg/dL — ABNORMAL HIGH (ref <1.0)

## 2019-11-23 LAB — FERRITIN: Ferritin: 268 ng/mL (ref 11–307)

## 2019-11-23 MED ORDER — PANTOPRAZOLE SODIUM 40 MG PO TBEC
40.0000 mg | DELAYED_RELEASE_TABLET | Freq: Every day | ORAL | Status: DC
  Administered 2019-11-24: 40 mg via ORAL
  Filled 2019-11-23: qty 1

## 2019-11-23 NOTE — Progress Notes (Signed)
[facility] Progress Note  Patient: [name]    RDE:[id number]  DOA: 11/22/2019     Date of Service: the patient was seen and examined on 11/23/2019  Brief hospital course: [name] is a 74 y.o. female with Past medical history of essential hypertension, depression. Patient presented with complaints of fatigue and tiredness. Patient was recently diagnosed with COVID-19 virus infection on 11/09/2019.  Currently plan is continue with current plan.  Assessment and Plan: 1. Hypokalemia In the setting of nausea vomiting as well as diarrhea. Currently replacing aggressively with IV. We will recheck it. Check magnesium. IV fluids with D5 half-normal saline and potassium.  2.  Diarrhea. Etiology not clear likely in the setting of COVID-19 illness. Worsened likely in the setting of Covid antibodies. We will rule out C. difficile. X-ray abdomen negative for any acute abnormality.  3.  COVID-19 viral infection. Has symptoms few days ago. Currently not having any shortness of breath or hypoxia. Chest x-ray positive for any acute infiltrate Has not received remdesivir. Diagnosis more than 13 days ago therefore do not think in the remdesivir to play any role at this point. Also received COVID-19 antibodies outpatient. Currently no indication for steroids given lack of hypoxia. Continue to monitor.  4.  Depression. Mood disorder. Continue Tegretol and Celexa.  5.  Essential hypertension. Continue Norvasc.  6.  Anemia. Patient reported black-colored bowel movement No bleeding here. Hemoglobin dropped from 12.8-10.8. We will continue SCD. No pharmacological DVT prophylaxis. Monitor H&H. FOBT.  Body mass index is 19.84 kg/m.   Diet: regular diet DVT Prophylaxis:  Place and maintain sequential compression device Start: 11/22/19 1949    Advance goals of care discussion: Full code  Family Communication: no family was present at bedside, at the time of interview.     Disposition:  Status is: Inpatient  Remains inpatient appropriate because:IV treatments appropriate due to intensity of illness or inability to take PO   Dispo: The patient is from: Home              Anticipated d/c is to: Home              Anticipated d/c date is: 3 days              Patient currently is not medically stable to d/c.  Subjective: 4 episodes of diarrhea reported.  No abdominal pain.  No nausea no vomiting.  No fever no chills. No bleeding reported.  Physical Exam:  General: Appear in mild distress, no Rash; Oral Mucosa Clear, moist. no Abnormal Neck Mass Or lumps, Conjunctiva normal  Cardiovascular: S1 and S2 Present, no Murmur, Respiratory: good respiratory effort, Bilateral Air entry present and Clear to Auscultation, no Crackles, no wheezes Abdomen: Bowel Sound present, Soft and no tenderness Extremities: no Pedal edema, no calf tenderness Neurology: alert and oriented to time, place, and person affect appropriate. no new focal deficit Gait not checked due to patient safety concerns  Vitals:   11/23/19 0458 11/23/19 0819 11/23/19 1149 11/23/19 1705  BP:  (!) 149/75 118/65 (!) 162/77  Pulse:  63 63 66  Resp:  17 16 15   Temp: 97.8 F (36.6 C) 97.8 F (36.6 C) 98.2 F (36.8 C) 97.7 F (36.5 C)  TempSrc: Oral Oral Oral Oral  SpO2:  97% 97% 99%  Weight:      Height:        Intake/Output Summary (Last 24 hours) at 11/23/2019 1839 Last data filed at 11/23/2019 1242 Gross  per 24 hour  Intake 1937.46 ml  Output --  Net 1937.46 ml   Filed Weights   11/22/19 1457 11/22/19 1458  Weight: 52.2 kg 50.8 kg    Data Reviewed: I have personally reviewed and interpreted daily labs, tele strips, imagings as discussed above. I reviewed all nursing notes, pharmacy notes, vitals, pertinent old records I have discussed plan of care as described above with RN and patient/family.  CBC: Recent Labs  Lab 11/22/19 1506 11/22/19 2237 11/23/19 0610  WBC 7.8 8.9 6.2   NEUTROABS  --  6.0 3.5  HGB 10.8* 9.7* 9.0*  HCT 29.4* 26.3* 25.1*  MCV 82.4 81.9 86.6  PLT 432* 394 924   Basic Metabolic Panel: Recent Labs  Lab 11/22/19 1506 11/22/19 2237 11/23/19 0610  NA 136 137 138  K 2.3* 2.7* 3.6  CL 99 103 107  CO2 23 23 25   GLUCOSE 134* 175* 117*  BUN 19 16 14   CREATININE 1.05* 1.05* 1.02*  CALCIUM 9.6 9.0 8.8*  MG 1.8  --   --     Studies: No results found.  Scheduled Meds: . amLODipine  5 mg Oral Daily  . vitamin C  500 mg Oral Daily  . carbamazepine  200 mg Oral Daily  . citalopram  40 mg Oral Daily  . [START ON 11/24/2019] pantoprazole  40 mg Oral Daily  . zinc sulfate  220 mg Oral Daily   Continuous Infusions: . dextrose 5 % and 0.45 % NaCl with KCl 20 mEq/L 75 mL/hr at 11/23/19 1242   PRN Meds: acetaminophen **OR** acetaminophen, morphine injection, ondansetron **OR** ondansetron (ZOFRAN) IV  Time spent: 35 minutes  Author: Berle Mull, MD [facility] 11/23/2019 6:39 PM  To reach On-call, see care teams to locate the attending and reach out via [email or web address]. Between 7PM-7AM, please contact night-coverage If you still have difficulty reaching the attending provider, please page the [facility] (Director on Call) for [facility] on amion for assistance.

## 2019-11-24 LAB — CBC WITH DIFFERENTIAL/PLATELET
Abs Immature Granulocytes: 0.07 10*3/uL (ref 0.00–0.07)
Basophils Absolute: 0 10*3/uL (ref 0.0–0.1)
Basophils Relative: 1 %
Eosinophils Absolute: 0.3 10*3/uL (ref 0.0–0.5)
Eosinophils Relative: 4 %
HCT: 24.9 % — ABNORMAL LOW (ref 36.0–46.0)
Hemoglobin: 8.9 g/dL — ABNORMAL LOW (ref 12.0–15.0)
Immature Granulocytes: 1 %
Lymphocytes Relative: 26 %
Lymphs Abs: 1.5 10*3/uL (ref 0.7–4.0)
MCH: 30.6 pg (ref 26.0–34.0)
MCHC: 35.7 g/dL (ref 30.0–36.0)
MCV: 85.6 fL (ref 80.0–100.0)
Monocytes Absolute: 0.6 10*3/uL (ref 0.1–1.0)
Monocytes Relative: 11 %
Neutro Abs: 3.4 10*3/uL (ref 1.7–7.7)
Neutrophils Relative %: 57 %
Platelets: 395 10*3/uL (ref 150–400)
RBC: 2.91 MIL/uL — ABNORMAL LOW (ref 3.87–5.11)
RDW: 12.6 % (ref 11.5–15.5)
WBC: 5.9 10*3/uL (ref 4.0–10.5)
nRBC: 0 % (ref 0.0–0.2)

## 2019-11-24 LAB — COMPREHENSIVE METABOLIC PANEL
ALT: 31 U/L (ref 0–44)
AST: 22 U/L (ref 15–41)
Albumin: 2.8 g/dL — ABNORMAL LOW (ref 3.5–5.0)
Alkaline Phosphatase: 55 U/L (ref 38–126)
Anion gap: 8 (ref 5–15)
BUN: 8 mg/dL (ref 8–23)
CO2: 23 mmol/L (ref 22–32)
Calcium: 8.8 mg/dL — ABNORMAL LOW (ref 8.9–10.3)
Chloride: 107 mmol/L (ref 98–111)
Creatinine, Ser: 0.89 mg/dL (ref 0.44–1.00)
GFR calc Af Amer: >60 mL/min (ref >60)
GFR calc non Af Amer: >60 mL/min (ref >60)
Glucose, Bld: 124 mg/dL — ABNORMAL HIGH (ref 70–99)
Potassium: 3 mmol/L — ABNORMAL LOW (ref 3.5–5.1)
Sodium: 138 mmol/L (ref 135–145)
Total Bilirubin: 0.5 mg/dL (ref 0.3–1.2)
Total Protein: 5.8 g/dL — ABNORMAL LOW (ref 6.5–8.1)

## 2019-11-24 LAB — FERRITIN: Ferritin: 257 ng/mL (ref 11–307)

## 2019-11-24 LAB — C-REACTIVE PROTEIN: CRP: 1.5 mg/dL — ABNORMAL HIGH (ref <1.0)

## 2019-11-24 LAB — FIBRIN DERIVATIVES D-DIMER (ARMC ONLY): Fibrin derivatives D-dimer (ARMC): 961.92 ng/mL (FEU) — ABNORMAL HIGH (ref 0.00–499.00)

## 2019-11-24 MED ORDER — ACETAMINOPHEN 500 MG PO TABS: 500.0000 mg | ORAL_TABLET | Freq: Four times a day (QID) | ORAL | 0 refills | Status: AC | PRN

## 2019-11-24 MED ORDER — ASCORBIC ACID 500 MG PO TABS: 500.0000 mg | ORAL_TABLET | Freq: Every day | ORAL | 0 refills | Status: DC

## 2019-11-24 MED ORDER — ZINC SULFATE 220 (50 ZN) MG PO CAPS: 220.0000 mg | ORAL_CAPSULE | Freq: Every day | ORAL | 0 refills | Status: DC

## 2019-11-24 MED ORDER — LOPERAMIDE HCL 2 MG PO CAPS: 2.0000 mg | ORAL_CAPSULE | ORAL | Status: DC | PRN

## 2019-11-24 MED ORDER — POTASSIUM CHLORIDE CRYS ER 20 MEQ PO TBCR
40.0000 meq | EXTENDED_RELEASE_TABLET | ORAL | Status: AC
  Administered 2019-11-24 (×2): 40 meq via ORAL
  Filled 2019-11-24 (×2): qty 2

## 2019-11-24 MED ORDER — POTASSIUM CHLORIDE ER 10 MEQ PO TBCR: 10.0000 meq | EXTENDED_RELEASE_TABLET | Freq: Every day | ORAL | 0 refills | Status: DC

## 2019-11-24 NOTE — Discharge Summary (Signed)
[facility] Discharge Summary   Patient: [name] BJY:[id number]  PCP: Josetta Huddle, MD  Date of admission: 11/22/2019   Date of discharge:  11/24/2019     Discharge Diagnoses:  Principal Problem:   Hypokalemia Active Problems:   Diarrhea   Abdominal pain   Nausea & vomiting   COVID-19 virus infection   Anemia   Depression  Admitted From: home Disposition:  Home   Recommendations for Outpatient Follow-up:  1. PCP: Follow-up with PCP in 1 week. 2. Follow up LABS/TEST: BMP, CBC   Follow-up Information    Josetta Huddle, MD. Schedule an appointment as soon as possible for a visit in 1 week(s).   Specialty: Internal Medicine Contact information: [address] [place]  [address] [phone]              Diet recommendation: Cardiac diet  Activity: The patient is advised to gradually reintroduce usual activities, as tolerated  Discharge Condition: stable  Code Status: Full code   History of present illness: As per the H and P dictated on admission, "[name] is a 74 y.o. female with Past medical history of essential hypertension, depression. Patient presented with complaints of fatigue and tiredness. Patient was recently diagnosed with COVID-19 virus infection on 11/09/2019. She had an exposure with a family member prior to this. She had some fever she had some fatigue and tiredness after this event. She also had some cough and shortness of breath. The symptoms resolved on their own. She went for an monoclonal antibody infusion on 11/15/2019. After this she started having more fatigue and more tiredness. She also had complaints of diarrhea. She reported having a bowel movement every 30 minutes which was liquidy and watery.  Without any blood but reported occasionally black-colored bowel movement. She did not have any abdominal pain with this. She started having nausea today and was unable to keep anything down and therefore decided to come  to the ER. On arrival to ER she started having vomiting.  Patient also reported some headache at the time of my evaluation. No focal deficit.  No chest pain.  Nausea seems to have resolved. Patient reports she is claustrophobic in the ER room."  Hospital Course:  Summary of her active problems in the hospital is as following. 1.Hypokalemia In the setting of nausea vomiting as well as diarrhea. Currently replaced and stable. Magnesium normal. Etiology of the hypokalemia is currently resolved.  Patient does not have any nausea or vomiting does not have any diarrhea.  2.Diarrhea.  Secondary to Covid Etiology not clear likely in the setting of COVID-19 illness. Worsened likely in the setting of Covid antibodies. C. difficile ruled out. X-ray abdomen negative for any acute abnormality.  3.COVID-19 viral infection. Has symptoms few days ago. Currently not having any shortness of breath or hypoxia. Chest x-ray positive for any acute infiltrate Has not received remdesivir. Diagnosis more than 13 days ago therefore do not think remdesivir is indicated especially given lack of any fever and patient having already symptoms in the past. Also received COVID-19 antibodies outpatient. Currently no indication for steroids given lack of hypoxia.  4.Depression. Mood disorder. Continue Tegretol and Celexa.  5.Essential hypertension. Continue Norvasc.  6.Anemia. Patient reported black-colored bowel movement No bleeding here.  Stools negative for any frank bleeding. Hemoglobin dropped from 12.8-10.8.  But now stable. Repeat CBC outpatient  Patient was ambulatory without any assistance. On the day of the discharge the patient's vitals were stable, and no  other acute medical condition were reported by patient. the patient was felt safe to be discharge at Home with no therapy needed on discharge.  Consultants: none Procedures: none  Discharge Exam: General: Appear in no  distress, no Rash; Oral Mucosa Clear, moist. Cardiovascular: S1 and S2 Present, no Murmur, Respiratory: normal respiratory effort, Bilateral Air entry present and no Crackles, no wheezes Abdomen: Bowel Sound present, Soft and no tenderness, no hernia Extremities: no Pedal edema, no calf tenderness Neurology: alert and oriented to time, place, and person affect appropriate.  Filed Weights   11/22/19 1457 11/22/19 1458  Weight: 52.2 kg 50.8 kg   Vitals:   11/24/19 0744 11/24/19 1236  BP: (!) 170/77 (!) 148/68  Pulse: 76 69  Resp: 16 16  Temp: 98.7 F (37.1 C) 98.8 F (37.1 C)  SpO2: 93% 98%    DISCHARGE MEDICATION: Allergies as of 11/24/2019      Reactions   Codone [hydrocodone] Itching, Nausea And Vomiting   Iohexol Other (See Comments)    Desc: omnipaque 300-pt states she has nausea and vomiting and SOB, pt premedicated w/prednisone and benadryl and ok   Sulfa Antibiotics Nausea And Vomiting      Medication List    STOP taking these medications   ibuprofen 400 MG tablet Commonly known as: ADVIL   losartan-hydrochlorothiazide 100-25 MG tablet Commonly known as: HYZAAR     TAKE these medications   acetaminophen 500 MG tablet Commonly known as: TYLENOL Take 1 tablet (500 mg total) by mouth 4 (four) times daily. Do not take if you have had an oxycodone/acetaminophen tablet within 4 hours.   amLODipine 5 MG tablet Commonly known as: NORVASC Take 5 mg by mouth daily.   ascorbic acid 500 MG tablet Commonly known as: VITAMIN C Take 1 tablet (500 mg total) by mouth daily.   carbamazepine 200 MG tablet Commonly known as: TEGRETOL Take 200 mg by mouth daily.   citalopram 40 MG tablet Commonly known as: CELEXA Take 40 mg by mouth in the morning and at bedtime.   potassium chloride 10 MEQ tablet Commonly known as: KLOR-CON Take 1 tablet (10 mEq total) by mouth daily.   zinc sulfate 220 (50 Zn) MG capsule Take 1 capsule (220 mg total) by mouth daily.       Allergies  Allergen Reactions  . Codone [Hydrocodone] Itching and Nausea And Vomiting  . Iohexol Other (See Comments)     Desc: omnipaque 300-pt states she has nausea and vomiting and SOB, pt premedicated w/prednisone and benadryl and ok   . Sulfa Antibiotics Nausea And Vomiting   Discharge Instructions    Diet - low sodium heart healthy   Complete by: As directed    Increase activity slowly   Complete by: As directed       The results of significant diagnostics from this hospitalization (including imaging, microbiology, ancillary and laboratory) are listed below for reference.    Significant Diagnostic Studies: DG Abdomen Acute W/Chest  Result Date: 11/22/2019 CLINICAL DATA:  COVID-19 positivity with fatigue and cough EXAM: DG ABDOMEN ACUTE W/ 1V CHEST COMPARISON:  01/30/2019 FINDINGS: Cardiac shadow is within normal limits. The lungs are well aerated bilaterally. Patchy airspace opacities are noted bilaterally consistent with the given clinical history. Aortic calcifications are noted. Scattered large and small bowel gas is noted. Postsurgical changes consistent with prior cholecystectomy are again noted. No free air is seen. No abnormal mass or abnormal calcifications are noted. No bony abnormality is seen. IMPRESSION: Scattered parenchymal  opacities within the lungs consistent with the given clinical history of COVID-19 positivity. Unremarkable abdomen. Electronically Signed   By: Inez Catalina M.D.   On: 11/22/2019 17:38    Microbiology: Recent Results (from the past 240 hour(s))  C Difficile Quick Screen w PCR reflex     Status: None   Collection Time: 11/23/19  4:00 PM   Specimen: STOOL  Result Value Ref Range Status   C Diff antigen NEGATIVE NEGATIVE Final   C Diff toxin NEGATIVE NEGATIVE Final   C Diff interpretation No C. difficile detected.  Final    Comment: Performed at [facility], [address] [place], [place] [address]     Labs: CBC: Recent Labs   Lab 11/22/19 1506 11/22/19 2237 11/23/19 0610 11/24/19 0426  WBC 7.8 8.9 6.2 5.9  NEUTROABS  --  6.0 3.5 3.4  HGB 10.8* 9.7* 9.0* 8.9*  HCT 29.4* 26.3* 25.1* 24.9*  MCV 82.4 81.9 86.6 85.6  PLT 432* 394 367 993   Basic Metabolic Panel: Recent Labs  Lab 11/22/19 1506 11/22/19 2237 11/23/19 0610 11/24/19 0426  NA 136 137 138 138  K 2.3* 2.7* 3.6 3.0*  CL 99 103 107 107  CO2 23 23 25 23   GLUCOSE 134* 175* 117* 124*  BUN 19 16 14 8   CREATININE 1.05* 1.05* 1.02* 0.89  CALCIUM 9.6 9.0 8.8* 8.8*  MG 1.8  --   --   --    Liver Function Tests: Recent Labs  Lab 11/23/19 0610 11/24/19 0426  AST 33 22  ALT 36 31  ALKPHOS 63 55  BILITOT 0.7 0.5  PROT 6.1* 5.8*  ALBUMIN 3.0* 2.8*   Time spent: 35 minutes  Signed:  Berle Mull  [facility]  11/24/2019 3:52 PM

## 2019-11-24 NOTE — Progress Notes (Signed)
Pt is being discharged home.  Discharge papers given and explained to pt. Pt verbalized understanding. Meds and f/u appointments reviewed. Awaiting transportation. 

## 2019-12-04 DIAGNOSIS — I10 Essential (primary) hypertension: Secondary | ICD-10-CM | POA: Diagnosis not present

## 2019-12-04 DIAGNOSIS — E876 Hypokalemia: Secondary | ICD-10-CM | POA: Diagnosis not present

## 2019-12-04 DIAGNOSIS — Z8616 Personal history of COVID-19: Secondary | ICD-10-CM | POA: Diagnosis not present

## 2019-12-10 DIAGNOSIS — E7800 Pure hypercholesterolemia, unspecified: Secondary | ICD-10-CM | POA: Diagnosis not present

## 2019-12-10 DIAGNOSIS — Z86010 Personal history of colonic polyps: Secondary | ICD-10-CM | POA: Diagnosis not present

## 2019-12-10 DIAGNOSIS — I672 Cerebral atherosclerosis: Secondary | ICD-10-CM | POA: Diagnosis not present

## 2019-12-10 DIAGNOSIS — E876 Hypokalemia: Secondary | ICD-10-CM | POA: Diagnosis not present

## 2019-12-10 DIAGNOSIS — E559 Vitamin D deficiency, unspecified: Secondary | ICD-10-CM | POA: Diagnosis not present

## 2019-12-10 DIAGNOSIS — Z79899 Other long term (current) drug therapy: Secondary | ICD-10-CM | POA: Diagnosis not present

## 2019-12-10 DIAGNOSIS — R739 Hyperglycemia, unspecified: Secondary | ICD-10-CM | POA: Diagnosis not present

## 2019-12-10 DIAGNOSIS — Z0000 Encounter for general adult medical examination without abnormal findings: Secondary | ICD-10-CM | POA: Diagnosis not present

## 2019-12-10 DIAGNOSIS — Z8616 Personal history of COVID-19: Secondary | ICD-10-CM | POA: Diagnosis not present

## 2019-12-10 DIAGNOSIS — F322 Major depressive disorder, single episode, severe without psychotic features: Secondary | ICD-10-CM | POA: Diagnosis not present

## 2019-12-10 DIAGNOSIS — K219 Gastro-esophageal reflux disease without esophagitis: Secondary | ICD-10-CM | POA: Diagnosis not present

## 2019-12-10 DIAGNOSIS — I10 Essential (primary) hypertension: Secondary | ICD-10-CM | POA: Diagnosis not present

## 2019-12-10 DIAGNOSIS — Z13820 Encounter for screening for osteoporosis: Secondary | ICD-10-CM | POA: Diagnosis not present

## 2019-12-10 DIAGNOSIS — G500 Trigeminal neuralgia: Secondary | ICD-10-CM | POA: Diagnosis not present

## 2019-12-31 DIAGNOSIS — E782 Mixed hyperlipidemia: Secondary | ICD-10-CM | POA: Diagnosis not present

## 2019-12-31 DIAGNOSIS — I10 Essential (primary) hypertension: Secondary | ICD-10-CM | POA: Diagnosis not present

## 2019-12-31 DIAGNOSIS — F329 Major depressive disorder, single episode, unspecified: Secondary | ICD-10-CM | POA: Diagnosis not present

## 2019-12-31 DIAGNOSIS — G459 Transient cerebral ischemic attack, unspecified: Secondary | ICD-10-CM | POA: Diagnosis not present

## 2019-12-31 DIAGNOSIS — F322 Major depressive disorder, single episode, severe without psychotic features: Secondary | ICD-10-CM | POA: Diagnosis not present

## 2020-01-27 DIAGNOSIS — E7800 Pure hypercholesterolemia, unspecified: Secondary | ICD-10-CM | POA: Diagnosis not present

## 2020-01-27 DIAGNOSIS — G459 Transient cerebral ischemic attack, unspecified: Secondary | ICD-10-CM | POA: Diagnosis not present

## 2020-01-27 DIAGNOSIS — F329 Major depressive disorder, single episode, unspecified: Secondary | ICD-10-CM | POA: Diagnosis not present

## 2020-01-27 DIAGNOSIS — I10 Essential (primary) hypertension: Secondary | ICD-10-CM | POA: Diagnosis not present

## 2020-01-27 DIAGNOSIS — K219 Gastro-esophageal reflux disease without esophagitis: Secondary | ICD-10-CM | POA: Diagnosis not present

## 2020-01-27 DIAGNOSIS — E782 Mixed hyperlipidemia: Secondary | ICD-10-CM | POA: Diagnosis not present

## 2020-01-27 DIAGNOSIS — F322 Major depressive disorder, single episode, severe without psychotic features: Secondary | ICD-10-CM | POA: Diagnosis not present

## 2020-03-20 ENCOUNTER — Emergency Department
Admission: EM | Admit: 2020-03-20 | Discharge: 2020-03-20 | Disposition: A | Discharge status: Home / Self Care | Payer: Medicare Other | Attending: Emergency Medicine | Admitting: Emergency Medicine

## 2020-03-20 ENCOUNTER — Other Ambulatory Visit: Payer: Self-pay

## 2020-03-20 ENCOUNTER — Emergency Department: Payer: Medicare Other

## 2020-03-20 ENCOUNTER — Encounter: Payer: Self-pay | Admitting: Emergency Medicine

## 2020-03-20 DIAGNOSIS — J111 Influenza due to unidentified influenza virus with other respiratory manifestations: Secondary | ICD-10-CM | POA: Diagnosis not present

## 2020-03-20 DIAGNOSIS — U071 COVID-19: Secondary | ICD-10-CM | POA: Insufficient documentation

## 2020-03-20 DIAGNOSIS — E860 Dehydration: Secondary | ICD-10-CM | POA: Diagnosis not present

## 2020-03-20 DIAGNOSIS — R531 Weakness: Secondary | ICD-10-CM | POA: Diagnosis not present

## 2020-03-20 DIAGNOSIS — Z79899 Other long term (current) drug therapy: Secondary | ICD-10-CM | POA: Diagnosis not present

## 2020-03-20 DIAGNOSIS — R059 Cough, unspecified: Secondary | ICD-10-CM | POA: Diagnosis not present

## 2020-03-20 DIAGNOSIS — I10 Essential (primary) hypertension: Secondary | ICD-10-CM | POA: Diagnosis not present

## 2020-03-20 DIAGNOSIS — R0602 Shortness of breath: Secondary | ICD-10-CM | POA: Diagnosis not present

## 2020-03-20 DIAGNOSIS — R5383 Other fatigue: Secondary | ICD-10-CM | POA: Diagnosis not present

## 2020-03-20 LAB — URINALYSIS, COMPLETE (UACMP) WITH MICROSCOPIC
Bilirubin Urine: NEGATIVE
Glucose, UA: NEGATIVE mg/dL
Hgb urine dipstick: NEGATIVE
Ketones, ur: NEGATIVE mg/dL
Leukocytes,Ua: NEGATIVE
Nitrite: NEGATIVE
Protein, ur: NEGATIVE mg/dL
Specific Gravity, Urine: 1.017 (ref 1.005–1.030)
pH: 5 (ref 5.0–8.0)

## 2020-03-20 LAB — RESP PANEL BY RT-PCR (FLU A&B, COVID) ARPGX2
Influenza A by PCR: POSITIVE — AB
Influenza B by PCR: NEGATIVE
SARS Coronavirus 2 by RT PCR: POSITIVE — AB

## 2020-03-20 LAB — CBC
HCT: 34 % — ABNORMAL LOW (ref 36.0–46.0)
Hemoglobin: 11.5 g/dL — ABNORMAL LOW (ref 12.0–15.0)
MCH: 30.3 pg (ref 26.0–34.0)
MCHC: 33.8 g/dL (ref 30.0–36.0)
MCV: 89.5 fL (ref 80.0–100.0)
Platelets: 213 10*3/uL (ref 150–400)
RBC: 3.8 MIL/uL — ABNORMAL LOW (ref 3.87–5.11)
RDW: 13.4 % (ref 11.5–15.5)
WBC: 5.9 10*3/uL (ref 4.0–10.5)
nRBC: 0 % (ref 0.0–0.2)

## 2020-03-20 LAB — BASIC METABOLIC PANEL
Anion gap: 13 (ref 5–15)
BUN: 19 mg/dL (ref 8–23)
CO2: 23 mmol/L (ref 22–32)
Calcium: 10.2 mg/dL (ref 8.9–10.3)
Chloride: 102 mmol/L (ref 98–111)
Creatinine, Ser: 1.12 mg/dL — ABNORMAL HIGH (ref 0.44–1.00)
GFR, Estimated: 52 mL/min — ABNORMAL LOW (ref >60)
Glucose, Bld: 149 mg/dL — ABNORMAL HIGH (ref 70–99)
Potassium: 3.2 mmol/L — ABNORMAL LOW (ref 3.5–5.1)
Sodium: 138 mmol/L (ref 135–145)

## 2020-03-20 LAB — TROPONIN I (HIGH SENSITIVITY)
Troponin I (High Sensitivity): 6 ng/L (ref <18)
Troponin I (High Sensitivity): 7 ng/L (ref <18)

## 2020-03-20 MED ORDER — SODIUM CHLORIDE 0.9 % IV SOLN
2.0000 g | Freq: Once | INTRAVENOUS | Status: DC
  Filled 2020-03-20: qty 20

## 2020-03-20 MED ORDER — ACETAMINOPHEN 500 MG PO TABS
1000.0000 mg | ORAL_TABLET | Freq: Once | ORAL | Status: AC
  Administered 2020-03-20: 18:00:00 1000 mg via ORAL
  Filled 2020-03-20: qty 2

## 2020-03-20 MED ORDER — SODIUM CHLORIDE 0.9 % IV BOLUS
1000.0000 mL | Freq: Once | INTRAVENOUS | Status: AC
  Administered 2020-03-20: 18:00:00 1000 mL via INTRAVENOUS

## 2020-03-20 MED ORDER — OSELTAMIVIR PHOSPHATE 75 MG PO CAPS: 75.0000 mg | ORAL_CAPSULE | Freq: Two times a day (BID) | ORAL | 0 refills | Status: AC

## 2020-03-20 MED ORDER — ONDANSETRON 4 MG PO TBDP: 4.0000 mg | ORAL_TABLET | Freq: Three times a day (TID) | ORAL | 0 refills | Status: DC | PRN

## 2020-03-20 MED ORDER — AZITHROMYCIN 500 MG PO TABS
500.0000 mg | ORAL_TABLET | Freq: Once | ORAL | Status: AC
  Administered 2020-03-20: 19:00:00 500 mg via ORAL
  Filled 2020-03-20: qty 1

## 2020-03-20 MED ORDER — BENZONATATE 100 MG PO CAPS
100.0000 mg | ORAL_CAPSULE | Freq: Once | ORAL | Status: AC
  Administered 2020-03-20: 19:00:00 100 mg via ORAL
  Filled 2020-03-20: qty 1

## 2020-03-20 MED ORDER — BENZONATATE 100 MG PO CAPS: 100.0000 mg | ORAL_CAPSULE | Freq: Three times a day (TID) | ORAL | 0 refills | Status: AC | PRN

## 2020-03-20 MED ORDER — AZITHROMYCIN 250 MG PO TABS: ORAL_TABLET | ORAL | 0 refills | Status: DC

## 2020-03-20 NOTE — ED Notes (Signed)
Per MD discharge pt when fluids are finished.

## 2020-03-20 NOTE — ED Notes (Signed)
Pt has c/o cough, SOB, and generalized weakness  for the last few weeks.  Pt states cough is productive.

## 2020-03-20 NOTE — ED Triage Notes (Signed)
Pt comes into the ED via POV c/o generalized weakness, fatigue, and cough x 2 weeks.  Pt states she feels very similar to when she last had COVID in August.  Pt currently has even and unlabored respirations and is afebrile.  Pt in NAD.

## 2020-03-20 NOTE — ED Provider Notes (Signed)
[facility] Emergency Department Provider Note  ____________________________________________   Event Date/Time   First MD Initiated Contact with Patient 03/20/20 1622     (approximate)  I have reviewed the triage vital signs and the nursing notes.   HISTORY  Chief Complaint Weakness and Cough    HPI [name] is a 74 y.o. female  With h/o HTN her with generalized fatigue. Pt reports that over the past several days, she has had worsening weakness, cough, and fatigue. She recently had COVID in September and states she has been somewhat weak, SOB since then. However, over the last several days she has now developed fever, chills, and worsening cough. She's had body aches. She has had cough with mild sputum production. Has been trying to force herself to eat/drink but has had poor appetite. No alleviating factors. Of note, her family that lives with her recently tested + for influenza. She did receive her flu vaccine. No other complaints. No abd pain, n/v/d. No urinary sx.        Past Medical History:  Diagnosis Date  . Hypertension     Patient Active Problem List   Diagnosis Date Noted  . Hypokalemia 11/22/2019  . Diarrhea 11/22/2019  . Abdominal pain 11/22/2019  . Nausea & vomiting 11/22/2019  . COVID-19 virus infection 11/22/2019  . Anemia 11/22/2019  . Depression 11/22/2019    History reviewed. No pertinent surgical history.  Prior to Admission medications   Medication Sig Start Date End Date Taking? Authorizing Provider  acetaminophen (TYLENOL) 500 MG tablet Take 1 tablet (500 mg total) by mouth every 6 (six) hours as needed. Do not take if you have had an oxycodone/acetaminophen tablet within 4 hours. 11/24/19   Lavina Hamman, MD  amLODipine (NORVASC) 5 MG tablet Take 5 mg by mouth daily.  08/14/19   [provider]  ascorbic acid (VITAMIN C) 500 MG tablet Take 1 tablet (500 mg total) by mouth daily. 11/24/19   Lavina Hamman, MD   azithromycin (ZITHROMAX Z-PAK) 250 MG tablet Take 2 tablets (500 mg) on  Day 1,  followed by 1 tablet (250 mg) once daily on Days 2 through 5. 03/20/20   Duffy Bruce, MD  benzonatate (TESSALON PERLES) 100 MG capsule Take 1 capsule (100 mg total) by mouth 3 (three) times daily as needed for cough. 03/20/20 03/20/21  Duffy Bruce, MD  carbamazepine (TEGRETOL) 200 MG tablet Take 200 mg by mouth daily.  09/13/19   [provider]  citalopram (CELEXA) 40 MG tablet Take 40 mg by mouth in the morning and at bedtime.  08/14/19   [provider]  ondansetron (ZOFRAN ODT) 4 MG disintegrating tablet Take 1 tablet (4 mg total) by mouth every 8 (eight) hours as needed for nausea or vomiting. 03/20/20   Duffy Bruce, MD  oseltamivir (TAMIFLU) 75 MG capsule Take 1 capsule (75 mg total) by mouth 2 (two) times daily for 5 days. 03/20/20 03/25/20  Duffy Bruce, MD  potassium chloride (KLOR-CON) 10 MEQ tablet Take 1 tablet (10 mEq total) by mouth daily. 11/24/19   Lavina Hamman, MD  zinc sulfate 220 (50 Zn) MG capsule Take 1 capsule (220 mg total) by mouth daily. 11/24/19   Lavina Hamman, MD    Allergies Codone [hydrocodone], Iohexol, and Sulfa antibiotics  History reviewed. No pertinent family history.  Social History Social History   Tobacco Use  . Smoking status: Never Smoker  . Smokeless tobacco: Never Used  Substance  Use Topics  . Alcohol use: No  . Drug use: No    Review of Systems  Review of Systems  Constitutional: Positive for fatigue. Negative for fever.  HENT: Negative for congestion and sore throat.   Eyes: Negative for visual disturbance.  Respiratory: Positive for cough and shortness of breath.   Cardiovascular: Negative for chest pain.  Gastrointestinal: Negative for abdominal pain, diarrhea, nausea and vomiting.  Genitourinary: Negative for flank pain.  Musculoskeletal: Positive for arthralgias and myalgias. Negative for back pain and neck pain.   Skin: Negative for rash and wound.  Neurological: Positive for weakness.  All other systems reviewed and are negative.    ____________________________________________  PHYSICAL EXAM:      VITAL SIGNS: ED Triage Vitals  Enc Vitals Group     BP 03/20/20 1422 (!) 150/73     Pulse Rate 03/20/20 1422 90     Resp 03/20/20 1648 10     Temp 03/20/20 1422 98.6 F (37 C)     Temp Source 03/20/20 1422 Oral     SpO2 03/20/20 1422 99 %     Weight 03/20/20 1422 110 lb (49.9 kg)     Height 03/20/20 1422 5\' 2"  (1.575 m)     Head Circumference --      Peak Flow --      Pain Score 03/20/20 1425 0     Pain Loc --      Pain Edu? --      Excl. in [place]? --      Physical Exam Vitals and nursing note reviewed.  Constitutional:      General: She is not in acute distress.    Appearance: She is well-developed.  HENT:     Head: Normocephalic and atraumatic.     Mouth/Throat:     Mouth: Mucous membranes are dry.  Eyes:     Conjunctiva/sclera: Conjunctivae normal.  Cardiovascular:     Rate and Rhythm: Normal rate and regular rhythm.     Heart sounds: Normal heart sounds. No murmur heard. No friction rub.  Pulmonary:     Effort: Pulmonary effort is normal. No respiratory distress.     Breath sounds: Rales (scattered, clear with coughing) present. No wheezing.  Abdominal:     General: There is no distension.     Palpations: Abdomen is soft.     Tenderness: There is no abdominal tenderness.  Musculoskeletal:     Cervical back: Neck supple.  Skin:    General: Skin is warm.     Capillary Refill: Capillary refill takes less than 2 seconds.  Neurological:     Mental Status: She is alert and oriented to person, place, and time.     Motor: No abnormal muscle tone.       ____________________________________________   LABS (all labs ordered are listed, but only abnormal results are displayed)  Labs Reviewed  RESP PANEL BY RT-PCR (FLU A&B, COVID) ARPGX2 - Abnormal; Notable for the  following components:      Result Value   SARS Coronavirus 2 by RT PCR POSITIVE (*)    Influenza A by PCR POSITIVE (*)    All other components within normal limits  BASIC METABOLIC PANEL - Abnormal; Notable for the following components:   Potassium 3.2 (*)    Glucose, Bld 149 (*)    Creatinine, Ser 1.12 (*)    GFR, Estimated 52 (*)    All other components within normal limits  CBC - Abnormal; Notable for the following  components:   RBC 3.80 (*)    Hemoglobin 11.5 (*)    HCT 34.0 (*)    All other components within normal limits  URINALYSIS, COMPLETE (UACMP) WITH MICROSCOPIC - Abnormal; Notable for the following components:   Color, Urine YELLOW (*)    APPearance HAZY (*)    Bacteria, UA RARE (*)    All other components within normal limits  CULTURE, BLOOD (SINGLE)  TROPONIN I (HIGH SENSITIVITY)  TROPONIN I (HIGH SENSITIVITY)    ____________________________________________  EKG: Normal sinus rhythm, VR 77. PR 14, QRS 88, QTc 468. ST changes noted in inferolateral leads which are unchanged from as far back as 2005. ________________________________________  RADIOLOGY All imaging, including plain films, CT scans, and ultrasounds, independently reviewed by me, and interpretations confirmed via formal radiology reads.  ED MD interpretation:   CXR: No acute process  Official radiology report(s): DG Chest 2 View  Result Date: 03/20/2020 CLINICAL DATA:  Generalized weakness, fatigue and cough for 2 weeks, COVID-19 10/2019 EXAM: CHEST - 2 VIEW COMPARISON:  11/08/2016 FINDINGS: Frontal and lateral views of the chest demonstrate a stable cardiac silhouette. No airspace disease, effusion, or pneumothorax. No acute bony abnormalities. IMPRESSION: 1. No acute intrathoracic process. Electronically Signed   By: Randa Ngo M.D.   On: 03/20/2020 17:21    ____________________________________________  PROCEDURES   Procedure(s) performed (including Critical  Care):  Procedures  ____________________________________________  INITIAL IMPRESSION / MDM / [place] / ED COURSE  As part of my medical decision making, I reviewed the following data within the [facility] notes reviewed and incorporated, Old chart reviewed, Notes from prior ED visits, and [place] Controlled Substance Database       *Autym Siess Colello was evaluated in Emergency Department on 03/20/2020 for the symptoms described in the history of present illness. She was evaluated in the context of the global COVID-19 pandemic, which necessitated consideration that the patient might be at risk for infection with the SARS-CoV-2 virus that causes COVID-19. Institutional protocols and algorithms that pertain to the evaluation of patients at risk for COVID-19 are in a state of rapid change based on information released by regulatory bodies including the CDC and federal and state organizations. These policies and algorithms were followed during the patient's care in the ED.  Some ED evaluations and interventions may be delayed as a result of limited staffing during the pandemic.*     Medical Decision Making:  74 yo F here with general fatigue and malaise. On arrival, pt non toxic but does appear mildly dehydrated. IVF given. Labs, imaging as above. Pt is influenza positive. COVID positive but suspect this is related to her prior infection, which seems to have resolved. EKG nonischemic, trop negative. CBC, BMP reviewed, largely unremarkable. UA without signs of UTI. CXR reviewed, shows no signs of PNA.   Suspect fatigue, malaise 2/2 influenza. She does have a preceding COVID infection with some sputum production, so atypical / early PNA is also a consideration. She is well appearing, tolerating PO, and ambulatory without hypoxia or difficulty. Will tx for influenza, possible early atypical PNA, and d/c with close outpt follow-up. Return precautions  given.  ____________________________________________  FINAL CLINICAL IMPRESSION(S) / ED DIAGNOSES  Final diagnoses:  Influenza  Dehydration     MEDICATIONS GIVEN DURING THIS VISIT:  Medications  sodium chloride 0.9 % bolus 1,000 mL (0 mLs Intravenous Stopped 03/20/20 1920)  acetaminophen (TYLENOL) tablet 1,000 mg (1,000 mg Oral Given 03/20/20 1825)  azithromycin (  ZITHROMAX) tablet 500 mg (500 mg Oral Given 03/20/20 1902)  benzonatate (TESSALON) capsule 100 mg (100 mg Oral Given 03/20/20 1902)     ED Discharge Orders         Ordered    oseltamivir (TAMIFLU) 75 MG capsule  2 times daily        03/20/20 1839    ondansetron (ZOFRAN ODT) 4 MG disintegrating tablet  Every 8 hours PRN        03/20/20 1839    azithromycin (ZITHROMAX Z-PAK) 250 MG tablet        03/20/20 1839    benzonatate (TESSALON PERLES) 100 MG capsule  3 times daily PRN        03/20/20 1839           Note:  This document was prepared using Dragon voice recognition software and may include unintentional dictation errors.   Duffy Bruce, MD 03/20/20 2238

## 2020-03-20 NOTE — ED Notes (Signed)
Pt ambulated with pulse ox. Pts O2 saturation did not go below 94% and her HR did not go above 100. Pt able to ambulate with a steady gait without any assistance. MD notified.

## 2020-03-24 DIAGNOSIS — E7800 Pure hypercholesterolemia, unspecified: Secondary | ICD-10-CM | POA: Diagnosis not present

## 2020-03-24 DIAGNOSIS — I10 Essential (primary) hypertension: Secondary | ICD-10-CM | POA: Diagnosis not present

## 2020-03-24 DIAGNOSIS — K219 Gastro-esophageal reflux disease without esophagitis: Secondary | ICD-10-CM | POA: Diagnosis not present

## 2020-03-24 DIAGNOSIS — F322 Major depressive disorder, single episode, severe without psychotic features: Secondary | ICD-10-CM | POA: Diagnosis not present

## 2020-03-24 DIAGNOSIS — F329 Major depressive disorder, single episode, unspecified: Secondary | ICD-10-CM | POA: Diagnosis not present

## 2020-03-24 DIAGNOSIS — G459 Transient cerebral ischemic attack, unspecified: Secondary | ICD-10-CM | POA: Diagnosis not present

## 2020-03-24 DIAGNOSIS — E782 Mixed hyperlipidemia: Secondary | ICD-10-CM | POA: Diagnosis not present

## 2020-03-25 LAB — CULTURE, BLOOD (SINGLE)
Culture: NO GROWTH
Special Requests: ADEQUATE

## 2020-04-27 DIAGNOSIS — I10 Essential (primary) hypertension: Secondary | ICD-10-CM | POA: Diagnosis not present

## 2020-04-27 DIAGNOSIS — N1831 Chronic kidney disease, stage 3a: Secondary | ICD-10-CM | POA: Diagnosis not present

## 2020-04-27 DIAGNOSIS — E7800 Pure hypercholesterolemia, unspecified: Secondary | ICD-10-CM | POA: Diagnosis not present

## 2020-04-27 DIAGNOSIS — R6889 Other general symptoms and signs: Secondary | ICD-10-CM | POA: Diagnosis not present

## 2020-04-27 DIAGNOSIS — F322 Major depressive disorder, single episode, severe without psychotic features: Secondary | ICD-10-CM | POA: Diagnosis not present

## 2020-04-29 DIAGNOSIS — I10 Essential (primary) hypertension: Secondary | ICD-10-CM | POA: Diagnosis not present

## 2020-04-29 DIAGNOSIS — E782 Mixed hyperlipidemia: Secondary | ICD-10-CM | POA: Diagnosis not present

## 2020-04-29 DIAGNOSIS — E7800 Pure hypercholesterolemia, unspecified: Secondary | ICD-10-CM | POA: Diagnosis not present

## 2020-04-29 DIAGNOSIS — K219 Gastro-esophageal reflux disease without esophagitis: Secondary | ICD-10-CM | POA: Diagnosis not present

## 2020-04-29 DIAGNOSIS — F322 Major depressive disorder, single episode, severe without psychotic features: Secondary | ICD-10-CM | POA: Diagnosis not present

## 2020-04-29 DIAGNOSIS — G459 Transient cerebral ischemic attack, unspecified: Secondary | ICD-10-CM | POA: Diagnosis not present

## 2020-04-29 DIAGNOSIS — N1831 Chronic kidney disease, stage 3a: Secondary | ICD-10-CM | POA: Diagnosis not present

## 2020-07-06 ENCOUNTER — Ambulatory Visit
Admission: RE | Admit: 2020-07-06 | Discharge: 2020-07-06 | Disposition: A | Discharge status: Home / Self Care | Payer: Medicare HMO | Source: Ambulatory Visit | Attending: Internal Medicine | Admitting: Internal Medicine

## 2020-07-06 ENCOUNTER — Other Ambulatory Visit: Payer: Self-pay | Admitting: Internal Medicine

## 2020-07-06 DIAGNOSIS — Z9049 Acquired absence of other specified parts of digestive tract: Secondary | ICD-10-CM | POA: Diagnosis not present

## 2020-07-06 DIAGNOSIS — R1310 Dysphagia, unspecified: Secondary | ICD-10-CM

## 2020-07-06 DIAGNOSIS — K625 Hemorrhage of anus and rectum: Secondary | ICD-10-CM | POA: Diagnosis not present

## 2020-07-06 DIAGNOSIS — R109 Unspecified abdominal pain: Secondary | ICD-10-CM | POA: Diagnosis not present

## 2020-07-06 DIAGNOSIS — R079 Chest pain, unspecified: Secondary | ICD-10-CM | POA: Diagnosis not present

## 2020-07-06 DIAGNOSIS — R1084 Generalized abdominal pain: Secondary | ICD-10-CM

## 2020-07-06 DIAGNOSIS — R0789 Other chest pain: Secondary | ICD-10-CM

## 2020-07-06 DIAGNOSIS — E876 Hypokalemia: Secondary | ICD-10-CM | POA: Diagnosis not present

## 2020-07-07 ENCOUNTER — Other Ambulatory Visit: Payer: Self-pay | Admitting: Internal Medicine

## 2020-07-07 DIAGNOSIS — R1310 Dysphagia, unspecified: Secondary | ICD-10-CM

## 2020-07-08 ENCOUNTER — Other Ambulatory Visit: Payer: Self-pay | Admitting: Internal Medicine

## 2020-07-08 ENCOUNTER — Ambulatory Visit
Admission: RE | Admit: 2020-07-08 | Discharge: 2020-07-08 | Disposition: A | Discharge status: Home / Self Care | Payer: Medicare HMO | Source: Ambulatory Visit | Attending: Internal Medicine | Admitting: Internal Medicine

## 2020-07-08 ENCOUNTER — Other Ambulatory Visit: Payer: Medicare Other

## 2020-07-08 ENCOUNTER — Other Ambulatory Visit: Payer: Self-pay

## 2020-07-08 DIAGNOSIS — R1310 Dysphagia, unspecified: Secondary | ICD-10-CM

## 2020-07-08 DIAGNOSIS — R109 Unspecified abdominal pain: Secondary | ICD-10-CM | POA: Diagnosis not present

## 2020-07-09 DIAGNOSIS — E782 Mixed hyperlipidemia: Secondary | ICD-10-CM | POA: Diagnosis not present

## 2020-07-09 DIAGNOSIS — N1831 Chronic kidney disease, stage 3a: Secondary | ICD-10-CM | POA: Diagnosis not present

## 2020-07-09 DIAGNOSIS — F322 Major depressive disorder, single episode, severe without psychotic features: Secondary | ICD-10-CM | POA: Diagnosis not present

## 2020-07-09 DIAGNOSIS — E7800 Pure hypercholesterolemia, unspecified: Secondary | ICD-10-CM | POA: Diagnosis not present

## 2020-07-09 DIAGNOSIS — I10 Essential (primary) hypertension: Secondary | ICD-10-CM | POA: Diagnosis not present

## 2020-07-09 DIAGNOSIS — K219 Gastro-esophageal reflux disease without esophagitis: Secondary | ICD-10-CM | POA: Diagnosis not present

## 2020-07-09 DIAGNOSIS — G459 Transient cerebral ischemic attack, unspecified: Secondary | ICD-10-CM | POA: Diagnosis not present

## 2020-07-13 DIAGNOSIS — I10 Essential (primary) hypertension: Secondary | ICD-10-CM | POA: Diagnosis not present

## 2020-07-13 DIAGNOSIS — R109 Unspecified abdominal pain: Secondary | ICD-10-CM | POA: Diagnosis not present

## 2020-07-13 DIAGNOSIS — R6889 Other general symptoms and signs: Secondary | ICD-10-CM | POA: Diagnosis not present

## 2020-07-13 DIAGNOSIS — E876 Hypokalemia: Secondary | ICD-10-CM | POA: Diagnosis not present

## 2020-07-13 DIAGNOSIS — K625 Hemorrhage of anus and rectum: Secondary | ICD-10-CM | POA: Diagnosis not present

## 2020-07-23 DIAGNOSIS — R109 Unspecified abdominal pain: Secondary | ICD-10-CM | POA: Diagnosis not present

## 2020-07-23 DIAGNOSIS — R079 Chest pain, unspecified: Secondary | ICD-10-CM | POA: Diagnosis not present

## 2020-07-23 DIAGNOSIS — R1310 Dysphagia, unspecified: Secondary | ICD-10-CM | POA: Diagnosis not present

## 2020-07-23 DIAGNOSIS — E876 Hypokalemia: Secondary | ICD-10-CM | POA: Diagnosis not present

## 2020-07-23 DIAGNOSIS — I10 Essential (primary) hypertension: Secondary | ICD-10-CM | POA: Diagnosis not present

## 2020-07-23 DIAGNOSIS — R6889 Other general symptoms and signs: Secondary | ICD-10-CM | POA: Diagnosis not present

## 2020-07-23 DIAGNOSIS — K625 Hemorrhage of anus and rectum: Secondary | ICD-10-CM | POA: Diagnosis not present

## 2020-07-24 ENCOUNTER — Other Ambulatory Visit: Payer: Medicare HMO

## 2020-07-24 ENCOUNTER — Ambulatory Visit
Admission: RE | Admit: 2020-07-24 | Discharge: 2020-07-24 | Disposition: A | Discharge status: Home / Self Care | Payer: Medicare HMO | Source: Ambulatory Visit | Attending: Internal Medicine | Admitting: Internal Medicine

## 2020-07-24 DIAGNOSIS — R1310 Dysphagia, unspecified: Secondary | ICD-10-CM

## 2020-07-24 DIAGNOSIS — K219 Gastro-esophageal reflux disease without esophagitis: Secondary | ICD-10-CM | POA: Diagnosis not present

## 2020-07-24 DIAGNOSIS — R6889 Other general symptoms and signs: Secondary | ICD-10-CM | POA: Diagnosis not present

## 2020-09-15 DIAGNOSIS — E782 Mixed hyperlipidemia: Secondary | ICD-10-CM | POA: Diagnosis not present

## 2020-09-15 DIAGNOSIS — G459 Transient cerebral ischemic attack, unspecified: Secondary | ICD-10-CM | POA: Diagnosis not present

## 2020-09-15 DIAGNOSIS — K219 Gastro-esophageal reflux disease without esophagitis: Secondary | ICD-10-CM | POA: Diagnosis not present

## 2020-09-15 DIAGNOSIS — I10 Essential (primary) hypertension: Secondary | ICD-10-CM | POA: Diagnosis not present

## 2020-09-15 DIAGNOSIS — F322 Major depressive disorder, single episode, severe without psychotic features: Secondary | ICD-10-CM | POA: Diagnosis not present

## 2020-09-15 DIAGNOSIS — N1831 Chronic kidney disease, stage 3a: Secondary | ICD-10-CM | POA: Diagnosis not present

## 2020-09-15 DIAGNOSIS — E7800 Pure hypercholesterolemia, unspecified: Secondary | ICD-10-CM | POA: Diagnosis not present

## 2020-09-15 DIAGNOSIS — F329 Major depressive disorder, single episode, unspecified: Secondary | ICD-10-CM | POA: Diagnosis not present

## 2020-11-23 DIAGNOSIS — I10 Essential (primary) hypertension: Secondary | ICD-10-CM | POA: Diagnosis not present

## 2020-11-23 DIAGNOSIS — E7800 Pure hypercholesterolemia, unspecified: Secondary | ICD-10-CM | POA: Diagnosis not present

## 2020-11-23 DIAGNOSIS — F322 Major depressive disorder, single episode, severe without psychotic features: Secondary | ICD-10-CM | POA: Diagnosis not present

## 2020-11-23 DIAGNOSIS — F329 Major depressive disorder, single episode, unspecified: Secondary | ICD-10-CM | POA: Diagnosis not present

## 2020-11-23 DIAGNOSIS — K219 Gastro-esophageal reflux disease without esophagitis: Secondary | ICD-10-CM | POA: Diagnosis not present

## 2020-11-23 DIAGNOSIS — N1831 Chronic kidney disease, stage 3a: Secondary | ICD-10-CM | POA: Diagnosis not present

## 2020-11-23 DIAGNOSIS — E782 Mixed hyperlipidemia: Secondary | ICD-10-CM | POA: Diagnosis not present

## 2020-11-23 DIAGNOSIS — G459 Transient cerebral ischemic attack, unspecified: Secondary | ICD-10-CM | POA: Diagnosis not present

## 2020-11-25 DIAGNOSIS — R6889 Other general symptoms and signs: Secondary | ICD-10-CM | POA: Diagnosis not present

## 2020-12-14 DIAGNOSIS — N1831 Chronic kidney disease, stage 3a: Secondary | ICD-10-CM | POA: Diagnosis not present

## 2020-12-14 DIAGNOSIS — I10 Essential (primary) hypertension: Secondary | ICD-10-CM | POA: Diagnosis not present

## 2020-12-14 DIAGNOSIS — E7800 Pure hypercholesterolemia, unspecified: Secondary | ICD-10-CM | POA: Diagnosis not present

## 2020-12-14 DIAGNOSIS — F322 Major depressive disorder, single episode, severe without psychotic features: Secondary | ICD-10-CM | POA: Diagnosis not present

## 2020-12-14 DIAGNOSIS — Z23 Encounter for immunization: Secondary | ICD-10-CM | POA: Diagnosis not present

## 2020-12-14 DIAGNOSIS — K5790 Diverticulosis of intestine, part unspecified, without perforation or abscess without bleeding: Secondary | ICD-10-CM | POA: Diagnosis not present

## 2020-12-14 DIAGNOSIS — E559 Vitamin D deficiency, unspecified: Secondary | ICD-10-CM | POA: Diagnosis not present

## 2020-12-14 DIAGNOSIS — R6889 Other general symptoms and signs: Secondary | ICD-10-CM | POA: Diagnosis not present

## 2020-12-14 DIAGNOSIS — G500 Trigeminal neuralgia: Secondary | ICD-10-CM | POA: Diagnosis not present

## 2020-12-14 DIAGNOSIS — K219 Gastro-esophageal reflux disease without esophagitis: Secondary | ICD-10-CM | POA: Diagnosis not present

## 2020-12-14 DIAGNOSIS — Z1389 Encounter for screening for other disorder: Secondary | ICD-10-CM | POA: Diagnosis not present

## 2020-12-14 DIAGNOSIS — Z0000 Encounter for general adult medical examination without abnormal findings: Secondary | ICD-10-CM | POA: Diagnosis not present

## 2021-02-24 DIAGNOSIS — E782 Mixed hyperlipidemia: Secondary | ICD-10-CM | POA: Diagnosis not present

## 2021-02-24 DIAGNOSIS — F322 Major depressive disorder, single episode, severe without psychotic features: Secondary | ICD-10-CM | POA: Diagnosis not present

## 2021-02-24 DIAGNOSIS — E7800 Pure hypercholesterolemia, unspecified: Secondary | ICD-10-CM | POA: Diagnosis not present

## 2021-02-24 DIAGNOSIS — K219 Gastro-esophageal reflux disease without esophagitis: Secondary | ICD-10-CM | POA: Diagnosis not present

## 2021-02-24 DIAGNOSIS — I10 Essential (primary) hypertension: Secondary | ICD-10-CM | POA: Diagnosis not present

## 2021-02-24 DIAGNOSIS — G459 Transient cerebral ischemic attack, unspecified: Secondary | ICD-10-CM | POA: Diagnosis not present

## 2021-02-24 DIAGNOSIS — N1831 Chronic kidney disease, stage 3a: Secondary | ICD-10-CM | POA: Diagnosis not present

## 2021-03-08 DIAGNOSIS — E782 Mixed hyperlipidemia: Secondary | ICD-10-CM | POA: Diagnosis not present

## 2021-03-08 DIAGNOSIS — K219 Gastro-esophageal reflux disease without esophagitis: Secondary | ICD-10-CM | POA: Diagnosis not present

## 2021-03-08 DIAGNOSIS — G459 Transient cerebral ischemic attack, unspecified: Secondary | ICD-10-CM | POA: Diagnosis not present

## 2021-03-08 DIAGNOSIS — N1831 Chronic kidney disease, stage 3a: Secondary | ICD-10-CM | POA: Diagnosis not present

## 2021-03-08 DIAGNOSIS — F322 Major depressive disorder, single episode, severe without psychotic features: Secondary | ICD-10-CM | POA: Diagnosis not present

## 2021-03-08 DIAGNOSIS — I10 Essential (primary) hypertension: Secondary | ICD-10-CM | POA: Diagnosis not present

## 2021-04-27 DIAGNOSIS — N1831 Chronic kidney disease, stage 3a: Secondary | ICD-10-CM | POA: Diagnosis not present

## 2021-04-27 DIAGNOSIS — I10 Essential (primary) hypertension: Secondary | ICD-10-CM | POA: Diagnosis not present

## 2021-04-27 DIAGNOSIS — E7800 Pure hypercholesterolemia, unspecified: Secondary | ICD-10-CM | POA: Diagnosis not present

## 2021-04-27 DIAGNOSIS — G459 Transient cerebral ischemic attack, unspecified: Secondary | ICD-10-CM | POA: Diagnosis not present

## 2021-04-27 DIAGNOSIS — K219 Gastro-esophageal reflux disease without esophagitis: Secondary | ICD-10-CM | POA: Diagnosis not present

## 2021-04-27 DIAGNOSIS — F322 Major depressive disorder, single episode, severe without psychotic features: Secondary | ICD-10-CM | POA: Diagnosis not present

## 2021-07-07 DIAGNOSIS — H5203 Hypermetropia, bilateral: Secondary | ICD-10-CM | POA: Diagnosis not present

## 2021-07-11 ENCOUNTER — Inpatient Hospital Stay: Admit: 2021-07-11 | Discharge: 2021-07-12 | Disposition: A | Discharge status: Home / Self Care | Payer: MEDICARE

## 2021-07-11 ENCOUNTER — Emergency Department: Admit: 2021-07-12 | Payer: MEDICARE | Primary: Family Medicine

## 2021-07-11 DIAGNOSIS — S161XXA Strain of muscle, fascia and tendon at neck level, initial encounter: Secondary | ICD-10-CM | POA: Diagnosis not present

## 2021-07-11 DIAGNOSIS — S0083XA Contusion of other part of head, initial encounter: Secondary | ICD-10-CM | POA: Diagnosis not present

## 2021-07-11 DIAGNOSIS — S0990XA Unspecified injury of head, initial encounter: Secondary | ICD-10-CM | POA: Diagnosis not present

## 2021-07-11 DIAGNOSIS — Z043 Encounter for examination and observation following other accident: Secondary | ICD-10-CM | POA: Diagnosis not present

## 2021-07-11 DIAGNOSIS — R519 Headache, unspecified: Secondary | ICD-10-CM | POA: Diagnosis not present

## 2021-07-11 NOTE — ED Notes (Signed)
NP & Md notified about repeat BP, patient stated  she needs to go home and take her medications. NP/MD approved to safely discharge patient home due to being Alert & Oriented.

## 2021-07-11 NOTE — ED Notes (Signed)
Pt fell today when stepping off her garage stairs and hurt her  right eye which is swollen and red.

## 2021-07-11 NOTE — ED Provider Notes (Signed)
ED Provider Notes by Evelina Bucy, NP at 07/11/21 2226                Author: Evelina Bucy, NP  Service: EMERGENCY  Author Type: Nurse Practitioner       Filed: 07/19/21 0936  Date of Service: 07/11/21 2226  Status: Attested Addendum          Editor: Evelina Bucy, NP (Nurse Practitioner)       Related Notes: Original Note by Evelina Bucy, NP (Nurse Practitioner) filed at 07/19/21 0935          Cosigner: Haze Justin, MD at 07/29/21 [phone]          Attestation signed by Haze Justin, MD at 07/29/21 1509          3:09 PM   I was personally available for consultation in the emergency department.  I have reviewed the chart and agree with the documentation recorded by the APP, including the assessment, treatment plan, and disposition.   Haze Justin, MD                                 [facility]Y [facility]PT   EMERGENCY DEPARTMENT HISTORY AND PHYSICAL EXAM           Date: 07/11/2021   Patient Name: Amber Dyer   MRN: [id number]   Birthdate: 1945/07/11   Date of evaluation: 07/11/2021   Provider: Evelina Bucy, NP    Note Started: 9:09 AM 07/19/21        HISTORY OF PRESENT ILLNESS          Chief Complaint       Patient presents with        ?  Fall           History Provided By: Patient      HPI: Amber Dyer is a 76 y.o. female with a past medical history significant for HTN presents to the emergency room with head injury.  Patient states  her dog jumped on her today causing her to fall on her garage floor.  She states she landed on her right side hitting her face against the floor.  She denies any LOC, N/V or blood thinner use.  She reports a mild headache and states she took 2 Tylenol  PTA with some improvement.  She presents with bruising to the right side of her face and around her eye.  She states that is what concerned her and why she presents to the ER for evaluation.  She denies any other sustained injuries.     PAST MEDICAL HISTORY     Past Medical History:   No past medical history on file.       Past Surgical History:   No past surgical history on file.      Family History:   No family history on file.      Social History:          Allergies:     Allergies        Allergen  Reactions         ?  Codeine  Nausea and Vomiting         ?  Sulfur-Alcohol  Hives           PCP: Other, Phys, MD      Current Meds:      Discharge Medication List  as of 07/11/2021 10:20 PM                    PHYSICAL EXAM          ED Triage Vitals     ED Encounter Vitals Group            BP  07/11/21 2001  (!) 195/82        Pulse (Heart Rate)  07/11/21 2001  71        Resp Rate  07/11/21 2001  18        Temp  07/11/21 2001  97.9 F (36.6 C)        Temp src  --          O2 Sat (%)  07/11/21 2001  97 %        Weight  07/11/21 2002  110 lb            Height  07/11/21 2002           Physical Exam   Vitals and nursing note reviewed.    Constitutional:        General: She is not in acute distress.      Appearance: Normal appearance.    HENT:       Head: Normocephalic. Contusion present. No laceration.       Jaw: There is normal jaw occlusion. No trismus.            Comments: Right sided facial bruising      Right Ear: No tenderness.       Left Ear: No tenderness.       Nose: Nose normal. No nasal deformity, septal deviation or nasal tenderness.       Mouth/Throat:       Lips: Pink.       Mouth: Mucous membranes are moist.       Dentition: Normal dentition. No dental tenderness.    Eyes:       General: Vision grossly intact.       Extraocular Movements: Extraocular movements intact.       Conjunctiva/sclera: Conjunctivae normal.       Pupils: Pupils are equal, round, and reactive to light.     Cardiovascular:       Rate and Rhythm: Normal rate and regular rhythm.       Heart sounds: Normal heart sounds.    Pulmonary:       Effort: Pulmonary effort is normal. No tachypnea or accessory muscle usage.       Breath sounds: Normal breath sounds. No wheezing or rales.     Abdominal:       Palpations: Abdomen is soft.       Tenderness: There is  no abdominal tenderness.     Musculoskeletal:          General: Normal range of motion.       Cervical back: Normal range of motion and neck supple. No crepitus. Muscular tenderness present. No pain with movement or spinous process  tenderness. Normal range of motion.    Skin:      General: Skin is warm and dry.       Findings: Ecchymosis present. No laceration.    Neurological:       General: No focal deficit present.       Mental Status: She is alert.       GCS: GCS eye subscore is 4. GCS verbal subscore is 5 . GCS motor  subscore is 6.       Cranial Nerves: Cranial nerves 2-12 are intact.       Sensory: Sensation is intact.       Coordination: Coordination is intact.       Gait: Gait is intact.    Psychiatric:          Mood and Affect: Mood normal.          Behavior: Behavior normal. Behavior is cooperative.             LAB, EKG AND DIAGNOSTIC RESULTS     Labs:   No results found for this or any previous visit (from the past 12 hour(s)).      EKG: Initial EKG interpreted by me. Not Applicable      Radiologic Studies:   Non-plain film images such as CT, Ultrasound and MRI are read by the radiologist. Plain radiographic images are visualized and preliminarily interpreted by the ED Physician with the following findings:  Not Applicable      Interpretation per the Radiologist below, if available at the time of this note:   CT Results (most recent):   Results from Hospital Encounter encounter on 07/11/21      CT MAXILLOFACIAL WO CONT      Narrative   EXAM: CT MAXILLOFACIAL WO CONT      INDICATION: fall/pain      COMPARISON: CT head without contrast July 11, 2021      CONTRAST:   None.      TECHNIQUE:  Multislice helical CT of the facial bones was performed in the axial   plane without intravenous contrast administration. Coronal and sagittal   reformations were generated.  CT dose reduction was achieved through use of a   standardized protocol tailored for this examination and automatic exposure   control for dose  modulation.      FINDINGS:      Bones: There is no fracture or other osseous abnormality      Paranasal sinuses: Clear      Orbits: The globes, optic nerves, and extraocular muscles are within normal   limits. There is mild RIGHT preseptal swelling.      Base of brain: Limited evaluation. No evidence of pathology.      Soft tissues: Within normal limits. No evidence of mass.      Miscellaneous: N/A      Impression   No acute maxillofacial fracture.        Narrative & Impression       EXAM: CT HEAD WO CONT       INDICATION: fall       COMPARISON: None.       CONTRAST: None.       TECHNIQUE: Unenhanced CT of the head was performed using 5 mm images. Brain and   bone windows were generated. Coronal and sagittal reformats. CT dose reduction   was achieved through use of a standardized protocol tailored for this   examination and automatic exposure control for dose modulation.         FINDINGS:   The ventricles and sulci are normal in size, shape and configuration. There is   mild periventricular and subcortical white matter hypodensity consistent with   chronic small vessel ischemic disease.. There is no intracranial hemorrhage,   extra-axial collection, or mass effect. The basilar cisterns are open. No CT   evidence of acute infarct. 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       The bone windows demonstrate no abnormalities. The visualized portions of the   paranasal sinuses and mastoid air cells are clear.       IMPRESSION   No acute intracranial abnormality. There is mild periventricular and subcortical   white matter hypodensity consistent with chronic small vessel ischemic disease.        CT SPINE CERV WO CONT: Patient Communication        Narrative & Impression       INDICATION: fall        Exam: Noncontrast CT of the cervical spine is performed with 2.5 mm collimation.   Sagittal and coronal reformatted images were also performed.       CT dose reduction was achieved through use  of a standardized protocol tailored   for this examination and automatic exposure control for dose modulation.       FINDINGS: There is no acute fracture or subluxation. The prevertebral soft   tissues are within normal limits. Bones are diffusely demineralized. Remaining   visualized soft tissues are normal. Multilevel cervical degenerative changes are   noted. There is moderate mucosal thickening within the left sphenoid sinus.       IMPRESSION   No acute fracture or subluxation.                PROCEDURES     Unless otherwise noted below, none.   Procedures          CRITICAL CARE TIME     Patient does not meet Critical Care Time, 0 minutes        ED COURSE and DIFFERENTIAL DIAGNOSIS/MDM     CC/HPI/PE Summary, DDx: Patient presents after head trauma. Due to patient's age, story and physical exam, will get CT head,  neck and maxillofacial to r/o skull fracture, traumatic bleed or other injury. Will continue to monitor mental status here.       Records Reviewed (source and summary of external notes): Prior medical records and Nursing notes      Vitals:       Vitals:           07/11/21 2001  07/11/21 2002         BP:  (!) 195/82       Pulse:  71       Resp:  18       Temp:  97.9 F (36.6 C)       SpO2:  97%       Weight:    49.9 kg (110 lb)         Height:    5\' 2"  (1.575 m)            ED COURSE     ED Course as of 07/19/21 0916       Sun Jul 11, 2021        2204  Patient updated on results.  [LP]              ED Course User Index   [LP] Jul 13, 2021, NP        Disposition Considerations (Tests not done, Shared Decision Making, Pt Expectation of Test or Treatment.):    Patient has had normal mentation throughout the ED stay.  No nausea or loss of consciousness during stay. No further complaints or abnormalities.  Vital signs have been reassuring.  The patient is cleared to be discharged home.    Understanding was insured that at  this time there is no evidence for a more malignant underlying process, but that early  in the process of an illness, an emergency department workup can be falsely reassuring.       Routine discharge counseling was given including the fact that any worsening, changing or persistent symptoms should prompt an immediate call or follow up with their primary physician or the emergency department. Specifically, symptoms that were stressed  to watch out for include any altered mentation, lethargy, vomiting, headaches, or any other worrying or new symptoms. Discussed use of ice and OTC Tylenol/IBU as needed.  Reinforced importance of getting adequate sleep and avoiding prolonged screen time.   Patient in stable condition with no focal deficits at time of discharge.  BP noted to be mildly elevated, patient states she is due to take her blood pressure medications and will take them immediately upon returning home tonight.      Patient was given the following medications:   Medications - No data to display      CONSULTS: (Who and What was discussed)   None       Social Determinants affecting Dx or Tx: None      Smoking Cessation: Not Applicable        FINAL IMPRESSION           1.  Minor head injury, initial encounter      2.  Contusion of face, initial encounter      3.  Strain of neck muscle, initial encounter         4.  Fall, initial encounter                DISPOSITION/PLAN     Discharged      Discharge Note: The patient is stable for discharge home. The signs, symptoms, diagnosis, and discharge instructions have been discussed, understanding conveyed, and agreed upon. The patient is to follow up as recommended or return to ER should their  symptoms worsen.        PATIENT REFERRED TO:     Follow-up Information                  Follow up With  Specialties  Details  Why  Contact Info              Your PCP    Schedule an appointment as soon as possible for a visit   for ER follow up                [facility]  Emergency Medicine    If symptoms worsen  [address]   [place] [id number]    [phone]                       DISCHARGE MEDICATIONS:     Discharge Medication List as of 07/11/2021 10:20 PM                    DISCONTINUED MEDICATIONS:     Discharge Medication List as of 07/11/2021 10:20 PM                  I am the Primary Clinician of Record: Evelina BucyLeah S Jarnell Cordaro, NP (electronically signed)      (Please note that parts of this dictation were completed with voice recognition software. Quite often unanticipated grammatical, syntax, homophones, and other interpretive errors are inadvertently transcribed  by the computer software. Please disregards these errors. Please excuse  any errors that have escaped final proofreading.)

## 2021-07-12 MED ORDER — IBUPROFEN 400 MG TAB
400 mg | ORAL_TABLET | Freq: Four times a day (QID) | ORAL | 0 refills | Status: AC | PRN
Start: 2021-07-12 — End: ?

## 2021-09-22 DIAGNOSIS — I10 Essential (primary) hypertension: Secondary | ICD-10-CM | POA: Diagnosis not present

## 2021-09-22 DIAGNOSIS — E782 Mixed hyperlipidemia: Secondary | ICD-10-CM | POA: Diagnosis not present

## 2021-10-06 DIAGNOSIS — L570 Actinic keratosis: Secondary | ICD-10-CM | POA: Diagnosis not present

## 2021-10-06 DIAGNOSIS — K5792 Diverticulitis of intestine, part unspecified, without perforation or abscess without bleeding: Secondary | ICD-10-CM | POA: Diagnosis not present

## 2021-10-11 DIAGNOSIS — I10 Essential (primary) hypertension: Secondary | ICD-10-CM | POA: Diagnosis not present

## 2021-10-11 DIAGNOSIS — K5792 Diverticulitis of intestine, part unspecified, without perforation or abscess without bleeding: Secondary | ICD-10-CM | POA: Diagnosis not present

## 2021-12-20 DIAGNOSIS — G500 Trigeminal neuralgia: Secondary | ICD-10-CM | POA: Diagnosis not present

## 2021-12-20 DIAGNOSIS — K219 Gastro-esophageal reflux disease without esophagitis: Secondary | ICD-10-CM | POA: Diagnosis not present

## 2021-12-20 DIAGNOSIS — E559 Vitamin D deficiency, unspecified: Secondary | ICD-10-CM | POA: Diagnosis not present

## 2021-12-20 DIAGNOSIS — I10 Essential (primary) hypertension: Secondary | ICD-10-CM | POA: Diagnosis not present

## 2021-12-20 DIAGNOSIS — N1831 Chronic kidney disease, stage 3a: Secondary | ICD-10-CM | POA: Diagnosis not present

## 2021-12-20 DIAGNOSIS — Z0000 Encounter for general adult medical examination without abnormal findings: Secondary | ICD-10-CM | POA: Diagnosis not present

## 2021-12-20 DIAGNOSIS — E7800 Pure hypercholesterolemia, unspecified: Secondary | ICD-10-CM | POA: Diagnosis not present

## 2021-12-20 DIAGNOSIS — F322 Major depressive disorder, single episode, severe without psychotic features: Secondary | ICD-10-CM | POA: Diagnosis not present

## 2021-12-20 DIAGNOSIS — Z23 Encounter for immunization: Secondary | ICD-10-CM | POA: Diagnosis not present

## 2022-01-13 ENCOUNTER — Other Ambulatory Visit: Payer: Self-pay | Admitting: Internal Medicine

## 2022-01-13 ENCOUNTER — Ambulatory Visit
Admission: RE | Admit: 2022-01-13 | Discharge: 2022-01-13 | Disposition: A | Discharge status: Home / Self Care | Payer: Medicare HMO | Source: Ambulatory Visit | Attending: Internal Medicine | Admitting: Internal Medicine

## 2022-01-13 DIAGNOSIS — M7989 Other specified soft tissue disorders: Secondary | ICD-10-CM | POA: Diagnosis not present

## 2022-01-13 DIAGNOSIS — W5581XA Bitten by other mammals, initial encounter: Secondary | ICD-10-CM | POA: Diagnosis not present

## 2022-01-13 DIAGNOSIS — T148XXA Other injury of unspecified body region, initial encounter: Secondary | ICD-10-CM

## 2022-01-13 DIAGNOSIS — S61051A Open bite of right thumb without damage to nail, initial encounter: Secondary | ICD-10-CM | POA: Diagnosis not present

## 2022-01-13 DIAGNOSIS — I10 Essential (primary) hypertension: Secondary | ICD-10-CM | POA: Diagnosis not present

## 2022-01-13 DIAGNOSIS — Z23 Encounter for immunization: Secondary | ICD-10-CM | POA: Diagnosis not present

## 2022-03-23 DIAGNOSIS — D649 Anemia, unspecified: Secondary | ICD-10-CM | POA: Diagnosis not present

## 2022-03-23 DIAGNOSIS — F322 Major depressive disorder, single episode, severe without psychotic features: Secondary | ICD-10-CM | POA: Diagnosis not present

## 2022-03-23 DIAGNOSIS — G500 Trigeminal neuralgia: Secondary | ICD-10-CM | POA: Diagnosis not present

## 2022-03-23 DIAGNOSIS — N1831 Chronic kidney disease, stage 3a: Secondary | ICD-10-CM | POA: Diagnosis not present

## 2022-03-23 DIAGNOSIS — E538 Deficiency of other specified B group vitamins: Secondary | ICD-10-CM | POA: Diagnosis not present

## 2022-03-23 DIAGNOSIS — I10 Essential (primary) hypertension: Secondary | ICD-10-CM | POA: Diagnosis not present

## 2022-05-12 DIAGNOSIS — M799 Soft tissue disorder, unspecified: Secondary | ICD-10-CM | POA: Diagnosis not present

## 2022-05-12 DIAGNOSIS — W010XXA Fall on same level from slipping, tripping and stumbling without subsequent striking against object, initial encounter: Secondary | ICD-10-CM | POA: Diagnosis not present

## 2022-05-12 DIAGNOSIS — S022XXA Fracture of nasal bones, initial encounter for closed fracture: Secondary | ICD-10-CM | POA: Diagnosis not present

## 2022-05-12 DIAGNOSIS — M542 Cervicalgia: Secondary | ICD-10-CM | POA: Diagnosis not present

## 2022-05-12 DIAGNOSIS — M50322 Other cervical disc degeneration at C5-C6 level: Secondary | ICD-10-CM | POA: Diagnosis not present

## 2022-05-12 DIAGNOSIS — S032XXA Dislocation of tooth, initial encounter: Secondary | ICD-10-CM | POA: Diagnosis not present

## 2022-05-12 DIAGNOSIS — W19XXXA Unspecified fall, initial encounter: Secondary | ICD-10-CM | POA: Diagnosis not present

## 2022-05-12 DIAGNOSIS — S00531A Contusion of lip, initial encounter: Secondary | ICD-10-CM | POA: Diagnosis not present

## 2022-05-12 DIAGNOSIS — Z8673 Personal history of transient ischemic attack (TIA), and cerebral infarction without residual deficits: Secondary | ICD-10-CM | POA: Diagnosis not present

## 2022-05-12 DIAGNOSIS — S0990XA Unspecified injury of head, initial encounter: Secondary | ICD-10-CM | POA: Diagnosis not present

## 2022-05-17 DIAGNOSIS — F322 Major depressive disorder, single episode, severe without psychotic features: Secondary | ICD-10-CM | POA: Diagnosis not present

## 2022-05-17 DIAGNOSIS — I10 Essential (primary) hypertension: Secondary | ICD-10-CM | POA: Diagnosis not present

## 2022-05-17 DIAGNOSIS — T07XXXA Unspecified multiple injuries, initial encounter: Secondary | ICD-10-CM | POA: Diagnosis not present

## 2022-05-17 DIAGNOSIS — S022XXA Fracture of nasal bones, initial encounter for closed fracture: Secondary | ICD-10-CM | POA: Diagnosis not present

## 2022-06-03 DIAGNOSIS — J342 Deviated nasal septum: Secondary | ICD-10-CM | POA: Diagnosis not present

## 2022-06-03 DIAGNOSIS — R0981 Nasal congestion: Secondary | ICD-10-CM | POA: Diagnosis not present

## 2022-06-03 DIAGNOSIS — S022XXA Fracture of nasal bones, initial encounter for closed fracture: Secondary | ICD-10-CM | POA: Diagnosis not present

## 2022-06-22 DIAGNOSIS — I10 Essential (primary) hypertension: Secondary | ICD-10-CM | POA: Diagnosis not present

## 2022-06-22 DIAGNOSIS — E538 Deficiency of other specified B group vitamins: Secondary | ICD-10-CM | POA: Diagnosis not present

## 2022-07-18 DIAGNOSIS — I10 Essential (primary) hypertension: Secondary | ICD-10-CM | POA: Diagnosis not present

## 2022-07-18 DIAGNOSIS — E538 Deficiency of other specified B group vitamins: Secondary | ICD-10-CM | POA: Diagnosis not present

## 2022-07-18 DIAGNOSIS — N1832 Chronic kidney disease, stage 3b: Secondary | ICD-10-CM | POA: Diagnosis not present

## 2022-07-18 DIAGNOSIS — R7989 Other specified abnormal findings of blood chemistry: Secondary | ICD-10-CM | POA: Diagnosis not present

## 2022-12-30 DIAGNOSIS — C4441 Basal cell carcinoma of skin of scalp and neck: Secondary | ICD-10-CM | POA: Diagnosis not present

## 2022-12-30 DIAGNOSIS — I10 Essential (primary) hypertension: Secondary | ICD-10-CM | POA: Diagnosis not present

## 2022-12-30 DIAGNOSIS — R399 Unspecified symptoms and signs involving the genitourinary system: Secondary | ICD-10-CM | POA: Diagnosis not present

## 2022-12-30 DIAGNOSIS — R55 Syncope and collapse: Secondary | ICD-10-CM | POA: Diagnosis not present

## 2022-12-30 DIAGNOSIS — Z23 Encounter for immunization: Secondary | ICD-10-CM | POA: Diagnosis not present

## 2022-12-30 DIAGNOSIS — M5459 Other low back pain: Secondary | ICD-10-CM | POA: Diagnosis not present

## 2023-01-16 DIAGNOSIS — Z23 Encounter for immunization: Secondary | ICD-10-CM | POA: Diagnosis not present

## 2023-01-16 DIAGNOSIS — E7800 Pure hypercholesterolemia, unspecified: Secondary | ICD-10-CM | POA: Diagnosis not present

## 2023-01-16 DIAGNOSIS — I672 Cerebral atherosclerosis: Secondary | ICD-10-CM | POA: Diagnosis not present

## 2023-01-16 DIAGNOSIS — I10 Essential (primary) hypertension: Secondary | ICD-10-CM | POA: Diagnosis not present

## 2023-01-16 DIAGNOSIS — N1832 Chronic kidney disease, stage 3b: Secondary | ICD-10-CM | POA: Diagnosis not present

## 2023-01-16 DIAGNOSIS — I700 Atherosclerosis of aorta: Secondary | ICD-10-CM | POA: Diagnosis not present

## 2023-01-16 DIAGNOSIS — E559 Vitamin D deficiency, unspecified: Secondary | ICD-10-CM | POA: Diagnosis not present

## 2023-01-16 DIAGNOSIS — Z0000 Encounter for general adult medical examination without abnormal findings: Secondary | ICD-10-CM | POA: Diagnosis not present

## 2023-01-16 DIAGNOSIS — F329 Major depressive disorder, single episode, unspecified: Secondary | ICD-10-CM | POA: Diagnosis not present

## 2023-02-01 DIAGNOSIS — D1801 Hemangioma of skin and subcutaneous tissue: Secondary | ICD-10-CM | POA: Diagnosis not present

## 2023-02-01 DIAGNOSIS — Z7189 Other specified counseling: Secondary | ICD-10-CM | POA: Diagnosis not present

## 2023-02-01 DIAGNOSIS — D485 Neoplasm of uncertain behavior of skin: Secondary | ICD-10-CM | POA: Diagnosis not present

## 2023-02-01 DIAGNOSIS — C44319 Basal cell carcinoma of skin of other parts of face: Secondary | ICD-10-CM | POA: Diagnosis not present

## 2023-02-01 DIAGNOSIS — C4441 Basal cell carcinoma of skin of scalp and neck: Secondary | ICD-10-CM | POA: Diagnosis not present

## 2023-02-01 DIAGNOSIS — L821 Other seborrheic keratosis: Secondary | ICD-10-CM | POA: Diagnosis not present

## 2023-02-27 DIAGNOSIS — C44319 Basal cell carcinoma of skin of other parts of face: Secondary | ICD-10-CM | POA: Diagnosis not present

## 2023-03-05 NOTE — Progress Notes (Unsigned)
Cardiology Office Note:  .   Date:  03/06/2023  ID:  [name], DOB 03-02-1946, MRN [id number] PCP: Marden Noble, MD (Inactive)  [facility] Providers Cardiologist:  Reatha Harps, MD { History of Present Illness: Marland Kitchen   [name] is a 77 y.o. female with history of HTN, HLD, CKD who presents for the evaluation of syncope at the request of [place], [place], [place].   History of Present Illness   The patient, a 77 year old with a history of hypertension, hyperlipidemia, and CKD stage 3a, presents for evaluation of syncope. The patient reports episodes of syncope, which she attributes to sudden drops in her blood pressure. These episodes began around April and have continued intermittently since then. The patient describes an episode where she passed out while taking a shower 6 months ago, which required assistance from the fire department. She has not had any more episodes since then, as she sits down when she feels one coming on. The patient denies any chest pain or trouble breathing associated with these episodes. She describes dizziness and light headedness with it. When she feels this coming on, she sits down and symptoms improve.   The patient is currently on losartan 100 mg and amlodipine 10 mg for her hypertension. She reports that her blood pressure can be as high as 150/77 in the morning, but it drops significantly if she is active during the day. She adjusts her medication intake based on her blood pressure readings. The patient also has high cholesterol, for which she was previously on Repatha. However, she is currently not on any cholesterol medication due to cost. Metoprolol was recently stopped.   The patient has a history of stroke, but she is not diabetic. She had a monitor implanted for her heart problems, but it was removed when it stopped working. The patient denies any known kidney issues. She does not eat regularly and does not consume enough fluids, which may contribute to  her episodes of syncope. No heart attack history.    She reports little water consumption. Mainly decaf tea. She can go an entire day without eating. The syncopal episode occurred in the morning without having anything to eat or drink.          Problem List HTN TIA -CT head 07/11/2021 -> Small vessel ischemic dz  GERD HLD CKD IIIa -eGFR 56    ROS: All other ROS reviewed and negative. Pertinent positives noted in the HPI.     Studies Reviewed: Marland Kitchen   EKG Interpretation Date/Time:  Monday March 06 2023 15:57:35 EST Ventricular Rate:  62 PR Interval:  164 QRS Duration:  90 QT Interval:  392 QTC Calculation: 397 R Axis:   -10  Text Interpretation: Sinus rhythm with Premature atrial complexes Nonspecific ST and T wave abnormality Confirmed by Lennie Odor [phone]) on 03/06/2023 3:59:25 PM   Physical Exam:   VS:  BP (!) 146/76 (BP Location: Left Arm, Patient Position: Sitting, Cuff Size: Normal)   Pulse 62   Ht 5\' 3"  (1.6 m)   Wt 116 lb 6.4 oz (52.8 kg)   SpO2 98%   BMI 20.62 kg/m    Wt Readings from Last 3 Encounters:  03/06/23 116 lb 6.4 oz (52.8 kg)  03/20/20 110 lb (49.9 kg)  11/22/19 112 lb (50.8 kg)    GEN: Well nourished, well developed in no acute distress NECK: No JVD; No carotid bruits CARDIAC: RRR, no murmurs, rubs, gallops RESPIRATORY:  Clear to auscultation without  rales, wheezing or rhonchi  ABDOMEN: Soft, non-tender, non-distended EXTREMITIES:  No edema; No deformity  ASSESSMENT AND PLAN: .   Assessment and Plan    Syncope Episode of syncope occurred approximately 6 months ago while in the shower, likely due to orthostatic hypotension. No recent episodes as patient sits down when feeling lightheaded. No hospitalization or further evaluation at the time of the event. No associated chest pain or dyspnea. -CV exam normal. EKG shows diffuse ST depressions. No CP today or with the episodes.  -Order echocardiogram and Lexi scan nuclear medicine stress  test. -Reduce Losartan to 50mg  daily. -Advise patient to check blood pressure twice daily and maintain a log. -Encourage regular meals and hydration (60-80 ounces daily). Episodes have coincided with poor oral intake.  -we discussed compression stockings, as this will help.  -Follow-up in 3 months.  Hypertension Currently managed with Losartan 100mg  daily and Amlodipine 10mg  daily. Patient reports variable blood pressure readings and adjusts medication intake accordingly. -Reduce Losartan to 50mg  daily. -Continue Amlodipine 10mg  daily. -Advise patient to check blood pressure twice daily and maintain a log. -Encourage regular meals and hydration.  Hyperlipidemia Previously managed with Repatha, but discontinued due to cost. Patient unable to tolerate statins. -Restart repatha per assistance program.    Abnormal EKG -diffuse ST depressions but no CP. Echo and stress as above.   General Health Maintenance -Advise patient to wear compression stockings while on her feet to help manage blood pressure fluctuations. -Encourage regular meals and hydration (60-80 ounces daily). -Advise patient to check blood pressure twice daily and maintain a log. -Follow-up in 3 months.          Informed Consent   Shared Decision Making/Informed Consent The risks [chest pain, shortness of breath, cardiac arrhythmias, dizziness, blood pressure fluctuations, myocardial infarction, stroke/transient ischemic attack, nausea, vomiting, allergic reaction, radiation exposure, metallic taste sensation and life-threatening complications (estimated to be 1 in 10,000)], benefits (risk stratification, diagnosing coronary artery disease, treatment guidance) and alternatives of a nuclear stress test were discussed in detail with [name] and she agrees to proceed.      Follow-up: Return in about 3 months (around 06/04/2023).  Signed, Lenna Gilford. Flora Lipps, MD, [facility]  [facility]  [address], [address] [place], [place] [address] [phone]  7:24 PM

## 2023-03-06 ENCOUNTER — Ambulatory Visit: Payer: Medicare PPO | Attending: Cardiovascular Disease | Admitting: Cardiovascular Disease

## 2023-03-06 ENCOUNTER — Encounter: Payer: Self-pay | Admitting: Cardiovascular Disease

## 2023-03-06 VITALS — BP 146/76 | HR 62 | Ht 63.0 in | Wt 116.4 lb

## 2023-03-06 DIAGNOSIS — R55 Syncope and collapse: Secondary | ICD-10-CM | POA: Diagnosis not present

## 2023-03-06 DIAGNOSIS — I10 Essential (primary) hypertension: Secondary | ICD-10-CM

## 2023-03-06 MED ORDER — LOSARTAN POTASSIUM 50 MG PO TABS: 50.0000 mg | ORAL_TABLET | Freq: Every day | ORAL | 3 refills | Status: DC

## 2023-03-06 NOTE — Patient Instructions (Addendum)
Medication Instructions:  - Start LOSARTAN 50MG  DAILY    *If you need a refill on your cardiac medications before your next appointment, please call your pharmacy*   Lab Work: NONE    If you have labs (blood work) drawn today and your tests are completely normal, you will receive your results only by: MyChart Message (if you have MyChart) OR A paper copy in the mail If you have any lab test that is abnormal or we need to change your treatment, we will call you to review the results.   Testing/Procedures: Echo will be scheduled at [address].  Your physician has requested that you have an echocardiogram. Echocardiography is a painless test that uses sound waves to create images of your heart. It provides your doctor with information about the size and shape of your heart and how well your heart's chambers and valves are working. This procedure takes approximately one hour. There are no restrictions for this procedure. Please do NOT wear cologne, perfume, aftershave, or lotions (deodorant is allowed). Please arrive 15 minutes prior to your appointment time.  Dr. Flora Lipps has ordered a Lexiscan Myocardial Perfusion Imaging Study.  Please arrive 15 minutes prior to your appointment time for registration and insurance purposes.   The test will take approximately 3 to 4 hours to complete; you may bring reading material.  If someone comes with you to your appointment, they will need to remain in the main lobby due to limited space in the testing area. **If you are pregnant or breastfeeding, please notify the nuclear lab prior to your appointment**   How to prepare for your Myocardial Perfusion Test: Do not eat or drink 3 hours prior to your test, except you may have water. Do not consume products containing caffeine (regular or decaffeinated) 12 hours prior to your test. (ex: coffee, chocolate, sodas, tea). Do wear comfortable clothes (no dresses or overalls) and walking shoes,  tennis shoes preferred (No heels or open toe shoes are allowed). Do NOT wear cologne, perfume, aftershave, or lotions (deodorant is allowed). If you use an inhaler, use it the AM of your test and bring it with you.  If you use a nebulizer, use it the AM of your test.  If these instructions are not followed, your test will have to be rescheduled.   Follow-Up: At [facility], you and your health needs are our priority.  As part of our continuing mission to provide you with exceptional heart care, we have created designated Provider Care Teams.  These Care Teams include your primary Cardiologist (physician) and Advanced Practice Providers (APPs -  Physician Assistants and Nurse Practitioners) who all work together to provide you with the care you need, when you need it.  We recommend signing up for the patient portal called "MyChart".  Sign up information is provided on this After Visit Summary.  MyChart is used to connect with patients for Virtual Visits (Telemedicine).  Patients are able to view lab/test results, encounter notes, upcoming appointments, etc.  Non-urgent messages can be sent to your provider as well.   To learn more about what you can do with MyChart, go to [email or web address].    Your next appointment:   3 month(s)  The format for your next appointment:   In Person  Provider:   Reatha Harps, MD    Other Instructions  Dr. Flora Lipps would like you to check your blood pressure daily and keep a log of the readings until your  next appointment.

## 2023-03-27 DIAGNOSIS — C4441 Basal cell carcinoma of skin of scalp and neck: Secondary | ICD-10-CM | POA: Diagnosis not present

## 2023-04-10 DIAGNOSIS — C4441 Basal cell carcinoma of skin of scalp and neck: Secondary | ICD-10-CM | POA: Diagnosis not present

## 2023-04-10 DIAGNOSIS — D485 Neoplasm of uncertain behavior of skin: Secondary | ICD-10-CM | POA: Diagnosis not present

## 2023-04-10 DIAGNOSIS — C44329 Squamous cell carcinoma of skin of other parts of face: Secondary | ICD-10-CM | POA: Diagnosis not present

## 2023-04-11 ENCOUNTER — Telehealth (HOSPITAL_COMMUNITY): Payer: Self-pay | Admitting: *Deleted

## 2023-04-11 NOTE — Telephone Encounter (Signed)
 Pt given instructions for MPI study.

## 2023-04-13 ENCOUNTER — Ambulatory Visit (HOSPITAL_COMMUNITY): Payer: Medicare Other | Attending: Cardiovascular Disease

## 2023-04-13 ENCOUNTER — Ambulatory Visit (HOSPITAL_BASED_OUTPATIENT_CLINIC_OR_DEPARTMENT_OTHER): Payer: Medicare Other

## 2023-04-13 DIAGNOSIS — I10 Essential (primary) hypertension: Secondary | ICD-10-CM | POA: Diagnosis not present

## 2023-04-13 DIAGNOSIS — R55 Syncope and collapse: Secondary | ICD-10-CM | POA: Diagnosis not present

## 2023-04-13 LAB — ECHOCARDIOGRAM COMPLETE
AR max vel: 2.03 cm2
AV Area VTI: 2.08 cm2
AV Area mean vel: 1.99 cm2
AV Mean grad: 3.7 mm[Hg]
AV Peak grad: 7.3 mm[Hg]
Ao pk vel: 1.35 m/s
Area-P 1/2: 1.99 cm2
Est EF: 65
P 1/2 time: 556 ms
S' Lateral: 3 cm

## 2023-04-13 LAB — MYOCARDIAL PERFUSION IMAGING
Base ST Depression (mm): 0.5 mm
Estimated workload: 1
Exercise duration (min): 1 min
Exercise duration (sec): 0 s
LV dias vol: 55 mL (ref 46–106)
LV sys vol: 15 mL
Nuc Stress EF: 72 %
Peak HR: 81 {beats}/min
Rest HR: 59 {beats}/min
Rest Nuclear Isotope Dose: 10.8 mCi
SDS: 0
SRS: 0
SSS: 0
ST Depression (mm): 2 mm
Stress Nuclear Isotope Dose: 30.4 mCi
TID: 0.93

## 2023-04-13 MED ORDER — REGADENOSON 0.4 MG/5ML IV SOLN
0.4000 mg | Freq: Once | INTRAVENOUS | Status: AC
  Administered 2023-04-13: 0.4 mg via INTRAVENOUS

## 2023-04-13 MED ORDER — TECHNETIUM TC 99M TETROFOSMIN IV KIT
10.8000 | PACK | Freq: Once | INTRAVENOUS | Status: AC | PRN
  Administered 2023-04-13: 10.8 via INTRAVENOUS

## 2023-04-13 MED ORDER — TECHNETIUM TC 99M TETROFOSMIN IV KIT
30.4000 | PACK | Freq: Once | INTRAVENOUS | Status: AC | PRN
Start: 2023-04-13 — End: 2023-04-13
  Administered 2023-04-13: 30.4 via INTRAVENOUS

## 2023-04-17 DIAGNOSIS — Z1231 Encounter for screening mammogram for malignant neoplasm of breast: Secondary | ICD-10-CM | POA: Diagnosis not present

## 2023-04-25 DIAGNOSIS — M542 Cervicalgia: Secondary | ICD-10-CM | POA: Diagnosis not present

## 2023-04-25 DIAGNOSIS — M549 Dorsalgia, unspecified: Secondary | ICD-10-CM | POA: Diagnosis not present

## 2023-04-26 ENCOUNTER — Telehealth: Payer: Self-pay

## 2023-04-26 NOTE — Telephone Encounter (Signed)
-----   Message from Matheson T Mylo sent at 04/13/2023  8:58 PM EST ----- Echo is normal. Normal heart function and normal valves. Please let her know. She does not have my chart.   Gerri Spore T. Flora Lipps, MD, [facility]  [facility]  [address], [address] [place], [place] [address] [phone]  8:58 PM

## 2023-04-26 NOTE — Telephone Encounter (Signed)
Patient verified name and DOB.  Informed patient stress test was normal. No blockages noted.  Patient voiced understanding.   Josie LPN

## 2023-04-26 NOTE — Telephone Encounter (Addendum)
Patient verified name and DOB. Informed pt echo is normal.  Pt voiced understanding.  Josie LPN

## 2023-05-01 DIAGNOSIS — C44329 Squamous cell carcinoma of skin of other parts of face: Secondary | ICD-10-CM | POA: Diagnosis not present

## 2023-06-14 DIAGNOSIS — D1801 Hemangioma of skin and subcutaneous tissue: Secondary | ICD-10-CM | POA: Diagnosis not present

## 2023-06-14 DIAGNOSIS — D485 Neoplasm of uncertain behavior of skin: Secondary | ICD-10-CM | POA: Diagnosis not present

## 2023-06-14 DIAGNOSIS — Z08 Encounter for follow-up examination after completed treatment for malignant neoplasm: Secondary | ICD-10-CM | POA: Diagnosis not present

## 2023-06-14 DIAGNOSIS — L814 Other melanin hyperpigmentation: Secondary | ICD-10-CM | POA: Diagnosis not present

## 2023-06-14 DIAGNOSIS — C44319 Basal cell carcinoma of skin of other parts of face: Secondary | ICD-10-CM | POA: Diagnosis not present

## 2023-06-14 DIAGNOSIS — L821 Other seborrheic keratosis: Secondary | ICD-10-CM | POA: Diagnosis not present

## 2023-06-14 DIAGNOSIS — C44519 Basal cell carcinoma of skin of other part of trunk: Secondary | ICD-10-CM | POA: Diagnosis not present

## 2023-06-14 DIAGNOSIS — Z85828 Personal history of other malignant neoplasm of skin: Secondary | ICD-10-CM | POA: Diagnosis not present

## 2023-06-15 NOTE — Progress Notes (Unsigned)
 Cardiology Office Note:  .   Date:  06/16/2023  ID:  [name], DOB [birth year], MRN [id number] PCP: Collene Mares, PA  [facility] Providers Cardiologist:  Reatha Harps, MD {  History of Present Illness: .    Chief Complaint  Patient presents with   Follow-up         [name] is a 78 y.o. female with history of HTN, HLD, CKD 3a who presents for follow-up. Evaluated for syncope with negative work-up.    History of Present Illness   [name] is a 78 year old female with hypertension who presents for follow-up.  Hypertension remains a significant concern, with blood pressure readings consistently ranging from 120s to 170s. She describes her blood pressure as 'staying high all the time' and has not experienced any more passing out spells since her blood pressure has remained elevated. She recalls one episode of syncope a couple of weeks ago but has not had any recent episodes. She can feel when her blood pressure starts to drop slightly, but this is infrequent and does not lead to dizziness or syncope.  Her current medication regimen includes losartan 50 mg twice a day and amlodipine 5 mg daily. She was previously on metoprolol, which was discontinued. She continues to take omeprazole 40 mg daily for her stomach and citalopram.   Her diet includes a bowl of grits or oatmeal in the morning, and she sometimes eats meals provided by a meal plan service, which includes a meat and two vegetables. She does not add salt to her food but is aware that packaged and processed foods contain salt. She drinks tea and some water throughout the day, aiming for two to three 16-ounce bottles of water daily. She eats out once or twice a month, usually opting for chicken kebabs and salad.  No chest pain, trouble breathing, or frequent dizziness. She reports one episode of syncope a couple of weeks ago but no recent passing out spells.          Problem List HTN TIA -CT head 07/11/2021  -> Small vessel ischemic dz  GERD HLD CKD IIIa -eGFR 56    ROS: All other ROS reviewed and negative. Pertinent positives noted in the HPI.     Studies Reviewed: Marland Kitchen        TTE 04/13/2023  1. Left ventricular ejection fraction, by estimation, is 65%. The left  ventricle has normal function. The left ventricle has no regional wall  motion abnormalities. There is mild left ventricular hypertrophy. Left  ventricular diastolic parameters are  consistent with Grade I diastolic dysfunction (impaired relaxation). The  average left ventricular global longitudinal strain is -25.3 %. The global  longitudinal strain is normal.   2. Right ventricular systolic function is normal. The right ventricular  size is normal. Tricuspid regurgitation signal is inadequate for assessing  PA pressure.   3. Left atrial size was mildly dilated.   4. The mitral valve is degenerative. Mild mitral valve regurgitation. No  evidence of mitral stenosis.   5. The aortic valve is tricuspid. There is mild calcification of the  aortic valve. There is mild thickening of the aortic valve. Aortic valve  regurgitation is mild. Aortic valve sclerosis is present, with no evidence  of aortic valve stenosis.   6. The inferior vena cava is normal in size with greater than 50%  respiratory variability, suggesting right atrial pressure of 3 mmHg.   NM 04/13/2023   The  study is normal. Findings are consistent with no ischemia and no infarction. The study is low risk.   LV perfusion is normal.   Left ventricular function is normal. Nuclear stress EF: 72%. The left ventricular ejection fraction is hyperdynamic (>65%). End diastolic cavity size is normal.   2.0 mm of horizontal ST depression (I, II and aVF) was noted.   Prior study available for comparison from 05/03/2005.  Physical Exam:   VS:  BP (!) 166/83 (BP Location: Left Arm, Patient Position: Sitting, Cuff Size: Small)   Pulse 70   Ht 5\' 2"  (1.575 m)   Wt 120 lb 6.4 oz  (54.6 kg)   SpO2 96%   BMI 22.02 kg/m    Wt Readings from Last 3 Encounters:  06/16/23 120 lb 6.4 oz (54.6 kg)  03/06/23 116 lb 6.4 oz (52.8 kg)  03/20/20 110 lb (49.9 kg)    GEN: Well nourished, well developed in no acute distress NECK: No JVD; No carotid bruits CARDIAC: RRR, no murmurs, rubs, gallops RESPIRATORY:  Clear to auscultation without rales, wheezing or rhonchi  ABDOMEN: Soft, non-tender, non-distended EXTREMITIES:  No edema; No deformity  ASSESSMENT AND PLAN: .   Assessment and Plan    Hypertension Hypertension remains uncontrolled with readings 140s-170s. Current regimen: losartan 50 mg BID, amlodipine 5 mg daily. No recent syncope, likely due to previous overmedication. Normal stress test and echocardiogram. She prefers BP around 140 mmHg to avoid hypotension. Emphasized risks of hypotension and syncope with aggressive adjustments. - Increase amlodipine to 10 mg daily. - Maintain losartan 50 mg BID. - Monitor BP daily, keep log. - Educate on reducing salt intake, avoid processed foods. - Encourage increased water intake, 4-6 glasses daily. - Consider adding diuretic if BP remains uncontrolled.  Chronic Kidney Disease Stage 3A Well-managed, no issues discussed.  Follow-up Plan to assess BP control and medication efficacy. - Schedule follow-up with PA in 3 months for BP evaluation. - Annual follow-up with cardiologist.              Follow-up: Return in about 3 months (around 09/16/2023).   Signed, Lenna Gilford. Flora Lipps, MD, [facility]  [facility]  [address] [place], [place] [address] [phone]  3:05 PM

## 2023-06-16 ENCOUNTER — Ambulatory Visit: Payer: Medicare PPO | Attending: Cardiovascular Disease | Admitting: Cardiovascular Disease

## 2023-06-16 ENCOUNTER — Encounter: Payer: Self-pay | Admitting: Cardiovascular Disease

## 2023-06-16 VITALS — BP 166/83 | HR 70 | Ht 62.0 in | Wt 120.4 lb

## 2023-06-16 DIAGNOSIS — I10 Essential (primary) hypertension: Secondary | ICD-10-CM | POA: Diagnosis not present

## 2023-06-16 DIAGNOSIS — R55 Syncope and collapse: Secondary | ICD-10-CM | POA: Diagnosis not present

## 2023-06-16 MED ORDER — AMLODIPINE BESYLATE 10 MG PO TABS: 10.0000 mg | ORAL_TABLET | Freq: Every day | ORAL | 3 refills | Status: AC

## 2023-06-16 NOTE — Patient Instructions (Signed)
 Medication Instructions:  - START AMLODIPINE 10MG  DAILY    *If you need a refill on your cardiac medications before your next appointment, please call your pharmacy*   Lab Work: NONE    If you have labs (blood work) drawn today and your tests are completely normal, you will receive your results only by: MyChart Message (if you have MyChart) OR A paper copy in the mail If you have any lab test that is abnormal or we need to change your treatment, we will call you to review the results.   Testing/Procedures: NONE    Follow-Up: At [facility], you and your health needs are our priority.  As part of our continuing mission to provide you with exceptional heart care, we have created designated Provider Care Teams.  These Care Teams include your primary Cardiologist (physician) and Advanced Practice Providers (APPs -  Physician Assistants and Nurse Practitioners) who all work together to provide you with the care you need, when you need it.  We recommend signing up for the patient portal called "MyChart".  Sign up information is provided on this After Visit Summary.  MyChart is used to connect with patients for Virtual Visits (Telemedicine).  Patients are able to view lab/test results, encounter notes, upcoming appointments, etc.  Non-urgent messages can be sent to your provider as well.   To learn more about what you can do with MyChart, go to [email or web address].    Your next appointment:   3 month(s)  The format for your next appointment:   In Person  Provider:   Edd Fabian, FNP, Micah Flesher, PA-C, Marjie Skiff, PA-C, Robet Leu, PA-C, Juanda Crumble, PA-C, Joni Reining, DNP, ANP, Azalee Course, PA-C, Bernadene Person, NP, or Reather Littler, NP    Then, Reatha Harps, MD will plan to see you again in 1 year(s).   Other Instructions   [facility] [place]:     s

## 2023-06-23 ENCOUNTER — Telehealth: Payer: Self-pay | Admitting: Cardiovascular Disease

## 2023-06-23 MED ORDER — FUROSEMIDE 40 MG PO TABS: 40.0000 mg | ORAL_TABLET | ORAL | 3 refills | Status: AC

## 2023-06-23 NOTE — Telephone Encounter (Signed)
 VM box full, not able to leave message. 06/23/23 at 1607

## 2023-06-23 NOTE — Telephone Encounter (Signed)
 After reviewing with DOD (Croitoru), called and spoke to pt.  Will start Lasix 40 mg 1-2 times per week prn for swelling.  Warned pt to NOT take this medication on a daily basis, and only take as needed up to twice weekly.  ED precautions (for DVT) were reviewed and pt verbalized understanding.  Also advised her to call us if symptoms do not resolve and/or call with any further concerns and our after hours service will get in touch with her.

## 2023-06-23 NOTE — Telephone Encounter (Signed)
 Pt c/o BLE edema (calves and ankles mostly), Left side is worse.  This all started Last Night.  This morning she had very bad cramps to Left leg; did not take anything for it; laid in bed until they subsided. She called her PCP who advised her to call [facility].  Saw Dr. Flora Lipps on 06/16/23. She states her Lasix was stopped a long time ago (CKD Stage 3A).  She has increased her Amlodipine to 10 mg every day. BP today=146/66, last night BP=143/62.  She is SOB only with increased activity, but is fine when walking around the house. No other symptoms.

## 2023-06-23 NOTE — Telephone Encounter (Signed)
 Pt c/o swelling/edema: STAT if pt has developed SOB within 24 hours  If swelling, where is the swelling located? Left leg  How much weight have you gained and in what time span? N/A  Have you gained 2 pounds in a day or 5 pounds in a week? No   Do you have a log of your daily weights (if so, list)? No   Are you currently taking a fluid pill? No   Are you currently SOB? No   Have you traveled recently in a car or plane for an extended period of time? No

## 2023-06-23 NOTE — Telephone Encounter (Signed)
 Pt would like staff to call her back on Monday 06/26/23 to schedule a f/u APP visit for week of 07/03/23. Monday 07/03/23 is not a good day for her.  Any other day that week works but she needs to talk to her daughter first.  Also advised her to wear compression stockings, pt states she will purchase some and elevate legs as much as possible.

## 2023-07-02 NOTE — Progress Notes (Unsigned)
 Cardiology Office Note    Date:  07/02/2023  ID:  [name], DOB 12-Oct-1945, MRN [id number] PCP:  Collene Mares, PA  Cardiologist:  Patty Harps, MD  Electrophysiologist:  None   Chief Complaint: ***  History of Present Illness: Patty Kitchen    [name] is a 78 yPageo. female with visit-pertinent history of hypertension, hyperlipidemia, stroke, CKD stage IIIa and syncope.  Patient was first evaluated by [name] on 03/06/2019 for for syncope.  Patient reported episodes of syncope that she attributed to sudden drops in her blood pressure.  These episodes began around April and had continued intermittently since then.  Patient describes an episode where she passed out while taking a shower 6 months prior.  Patient reported that she had not had any further episodes since then as she sits down when she feels 1 coming on.  Patient notes that she would have dizziness and lightheadedness prior to starting of episodes, when she feels this coming on she sits down and symptoms resolved.  It was noted that patient was on losartan 100 mg and amlodipine 10 mg for hypertension, she noted that her blood pressure could be as high as 150/70 7 in the morning but dropped significantly if she is active during the day.  Echocardiogram and Lexiscan nuclear medicine stress test was recommended.  Echocardiogram on 04/13/2023 indicated LVEF of 65%, no RWMA, mild LVH, grade 1 diastolic dysfunction, RV systolic function was normal, left atrial size was mildly dilated, mild mitral valve regurgitation was noted no evidence of mitral stenosis, aortic valve sclerosis was present without any evidence of stenosis.  MPI on 04/13/2023 was normal and low risk.  Patient was last in clinic by [name] on 06/16/2023.  Patient's regimen included losartan 50 mg twice a day and amlodipine 5 mg daily.  She noted 1 episode of syncope a few weeks prior but no recent spells.  Patient's amlodipine was increased to 10 mg daily, she was continued on  losartan 50 mg twice daily.  Today she presents reporting  Lower extremity edema:  Syncope: Patient with an episode of syncope in the summer 2024 while showering, felt to likely be due to orthostatic hypotension.  Patient denied any further episodes as she sits down when feeling lightheaded. Hypertension: Blood pressure today  Continue   Labwork independently reviewed:   ROS: .   *** denies chest pain, shortness of breath, lower extremity edema, fatigue, palpitations, melena, hematuria, hemoptysis, diaphoresis, weakness, presyncope, syncope, orthopnea, and PND.  All other systems are reviewed and otherwise negative.  Studies Reviewed: Patty Kitchen    EKG:  EKG is ordered today, personally reviewed, demonstrating ***     CV Studies: Cardiac studies reviewed are outlined and summarized above. Otherwise please see EMR for full report. Cardiac Studies & Procedures   ______________________________________________________________________________________________   STRESS TESTS  MYOCARDIAL PERFUSION IMAGING 04/13/2023  Narrative   The study is normal. Findings are consistent with no ischemia and no infarction. The study is low risk.   LV perfusion is normal.   Left ventricular function is normal. Nuclear stress EF: 72%. The left ventricular ejection fraction is hyperdynamic (>65%). End diastolic cavity size is normal.   2Page0 mm of horizontal ST depression (I, II and aVF) was noted.   Prior study available for comparison from 05/03/2005.   ECHOCARDIOGRAM  ECHOCARDIOGRAM COMPLETE 04/13/2023  Narrative ECHOCARDIOGRAM REPORT    Patient Name:   [name]    Date of Exam: 04/13/2023 Medical Rec #:  [id number]     Height:       63Page0 in Accession #:    [id number]    Weight:       116Page4 lb Date of Birth:  [birth year]      BSA:          1Page536 m Patient Age:    77 years      BP:           146/76 mmHg Patient Gender: F             HR:           60 bpm. Exam Location:  [address]  Procedure: 2D Echo,  Cardiac Doppler, Color Doppler and Strain Analysis  Indications:    R55 Syncope  History:        Patient has prior history of Echocardiogram examinations, most recent 04/26/2005. Stroke; Risk Factors:Hypertension and Dyslipidemia.  Sonographer:    [name] RDCS Referring Phys: [id number] [name]  IMPRESSIONS   1. Left ventricular ejection fraction, by estimation, is 65%. The left ventricle has normal function. The left ventricle has no regional wall motion abnormalities. There is mild left ventricular hypertrophy. Left ventricular diastolic parameters are consistent with Grade I diastolic dysfunction (impaired relaxation). The average left ventricular global longitudinal strain is -25Page3 %. The global longitudinal strain is normal. 2. Right ventricular systolic function is normal. The right ventricular size is normal. Tricuspid regurgitation signal is inadequate for assessing PA pressure. 3. Left atrial size was mildly dilated. 4. The mitral valve is degenerative. Mild mitral valve regurgitation. No evidence of mitral stenosis. 5. The aortic valve is tricuspid. There is mild calcification of the aortic valve. There is mild thickening of the aortic valve. Aortic valve regurgitation is mild. Aortic valve sclerosis is present, with no evidence of aortic valve stenosis. 6. The inferior vena cava is normal in size with greater than 50% respiratory variability, suggesting right atrial pressure of 3 mmHg.  FINDINGS Left Ventricle: Left ventricular ejection fraction, by estimation, is 65%. The left ventricle has normal function. The left ventricle has no regional wall motion abnormalities. The average left ventricular global longitudinal strain is -25Page3 %. The global longitudinal strain is normal. The left ventricular internal cavity size was normal in size. There is mild left ventricular hypertrophy. Left ventricular diastolic parameters are consistent with Grade I diastolic  dysfunction (impaired relaxation).  Right Ventricle: The right ventricular size is normal. No increase in right ventricular wall thickness. Right ventricular systolic function is normal. Tricuspid regurgitation signal is inadequate for assessing PA pressure.  Left Atrium: Left atrial size was mildly dilated.  Right Atrium: Right atrial size was normal in size.  Pericardium: There is no evidence of pericardial effusion. Presence of epicardial fat layer.  Mitral Valve: The mitral valve is degenerative in appearance. Mild mitral annular calcification. Mild mitral valve regurgitation. No evidence of mitral valve stenosis.  Tricuspid Valve: The tricuspid valve is normal in structure. Tricuspid valve regurgitation is not demonstrated. No evidence of tricuspid stenosis.  The aortic valve is tricuspid. There is mild calcification of the aortic valve. There is mild thickening of the aortic valve. Aortic valve regurgitation is mild. Aortic valve sclerosis is present, with no evidence of aortic valve stenosis. Aortic valve mean gradient measures 3Page7 mmHg. Aortic valve peak gradient measures 7Page3 mmHg. Aortic valve area, by VTI measures 2Page08 cm.  Pulmonic Valve: The pulmonic valve was normal in structure. Pulmonic valve regurgitation is trivial. No evidence of pulmonic stenosis.  Aorta: The aortic root is normal in size and structure. Ascending aorta measurements are within normal limits for age when indexed to body surface area.  Venous: The inferior vena cava is normal in size with greater than 50% respiratory variability, suggesting right atrial pressure of 3 mmHg.  IAS/Shunts: The atrial septum is grossly normal.   LEFT VENTRICLE PLAX 2D LVIDd:         4Page30 cm   Diastology LVIDs:         3Page00 cm   LV e' medial:    4Page62 cm/s LV PW:         1Page10 cm   LV E/e' medial:  13Page8 LV IVS:        1Page20 cm   LV e' lateral:   4Page78 cm/s LVOT diam:     1Page90 cm   LV E/e' lateral: 13Page4 LV SV:         68 LV SV  Index:   45        2D Longitudinal Strain LVOT Area:     2Page84 cm  2D Strain GLS Avg:     -25Page3 %   RIGHT VENTRICLE             IVC RV Basal diam:  3Page30 cm     IVC diam: 1Page30 cm RV S prime:     11Page25 cm/s TAPSE (M-mode): 2Page3 cm  LEFT ATRIUM             Index        RIGHT ATRIUM          Index LA diam:        4Page40 cm 2Page86 cm/m   RA Area:     7Page84 cm LA Vol (A2C):   47Page2 ml 30Page72 ml/m  RA Volume:   15Page00 ml 9Page76 ml/m LA Vol (A4C):   57Page6 ml 37Page49 ml/m LA Biplane Vol: 52Page4 ml 34Page11 ml/m AORTIC VALVE AV Area (Vmax):    2Page03 cm AV Area (Vmean):   1Page99 cm AV Area (VTI):     2Page08 cm AV Vmax:           134Page75 cm/s AV Vmean:          91Page538 cm/s AV VTI:            0Page329 m AV Peak Grad:      7Page3 mmHg AV Mean Grad:      3Page7 mmHg LVOT Vmax:         96Page30 cm/s LVOT Vmean:        64Page100 cm/s LVOT VTI:          0Page242 m LVOT/AV VTI ratio: 0Page73 AI PHT:            556 msec  AORTA Ao Root diam: 3Page10 cm Ao Asc diam:  3Page80 cm  MITRAL VALVE MV Area (PHT): 1Page99 cm    SHUNTS MV Decel Time: 381 msec    Systemic VTI:  0Page24 m MV E velocity: 63Page95 cm/s  Systemic Diam: 1Page90 cm MV A velocity: 90Page65 cm/s MV E/A ratio:  0Page71  Weston Brass MD Electronically signed by Weston Brass MD Signature Date/Time: 04/13/2023/12:43:57 PM    Final          ______________________________________________________________________________________________       Current Reported Medications:.    No outpatient medications have been marked as taking for the 07/06/23 encounter (Appointment) with Rip Harbour, NP.    Physical Exam:  VS:  There were no vitals taken for this visit.   Wt Readings from Last 3 Encounters:  06/16/23 120 lb 6Page4 oz (54Page6 kg)  03/06/23 116 lb 6Page4 oz (52Page8 kg)  03/20/20 110 lb (49Page9 kg)    GEN: Well nourished, well developed in no acute distress NECK: No JVD; No carotid bruits CARDIAC: ***RRR, no murmurs, rubs, gallops RESPIRATORY:  Clear to auscultation  without rales, wheezing or rhonchi  ABDOMEN: Soft, non-tender, non-distended EXTREMITIES:  No edema; No acute deformity     Asessement and Plan:.     ***     Disposition: F/u with ***  Signed, Rip Harbour, NP

## 2023-07-06 ENCOUNTER — Ambulatory Visit: Attending: Cardiology | Admitting: Cardiology

## 2023-07-06 ENCOUNTER — Encounter: Payer: Self-pay | Admitting: Cardiology

## 2023-07-06 VITALS — BP 156/84 | HR 64 | Ht 62.0 in | Wt 121.2 lb

## 2023-07-06 DIAGNOSIS — R55 Syncope and collapse: Secondary | ICD-10-CM | POA: Diagnosis not present

## 2023-07-06 DIAGNOSIS — R600 Localized edema: Secondary | ICD-10-CM

## 2023-07-06 DIAGNOSIS — R252 Cramp and spasm: Secondary | ICD-10-CM | POA: Diagnosis not present

## 2023-07-06 DIAGNOSIS — I10 Essential (primary) hypertension: Secondary | ICD-10-CM

## 2023-07-06 NOTE — Patient Instructions (Signed)
 Medication Instructions:  No medication changes were made during today's visit.  *If you need a refill on your cardiac medications before your next appointment, please call your pharmacy*   Lab Work: Labs will be drawn today. BMET  If you have labs (blood work) drawn today and your tests are completely normal, you will receive your results only by: MyChart Message (if you have MyChart) OR A paper copy in the mail If you have any lab test that is abnormal or we need to change your treatment, we will call you to review the results.   Testing/Procedures: No procedures were ordered during today's visit.    Follow-Up: At [facility], you and your health needs are our priority.  As part of our continuing mission to provide you with exceptional heart care, we have created designated Provider Care Teams.  These Care Teams include your primary Cardiologist (physician) and Advanced Practice Providers (APPs -  Physician Assistants and Nurse Practitioners) who all work together to provide you with the care you need, when you need it.  We recommend signing up for the patient portal called "MyChart".  Sign up information is provided on this After Visit Summary.  MyChart is used to connect with patients for Virtual Visits (Telemedicine).  Patients are able to view lab/test results, encounter notes, upcoming appointments, etc.  Non-urgent messages can be sent to your provider as well.   To learn more about what you can do with MyChart, go to [email or web address].    Your next appointment:   1 month(s)  Provider:   Reather Littler, NP        Other Instructions Thank you for choosing [facility]!

## 2023-07-07 ENCOUNTER — Other Ambulatory Visit: Payer: Self-pay

## 2023-07-07 ENCOUNTER — Telehealth: Payer: Self-pay

## 2023-07-07 DIAGNOSIS — Z79899 Other long term (current) drug therapy: Secondary | ICD-10-CM

## 2023-07-07 DIAGNOSIS — I10 Essential (primary) hypertension: Secondary | ICD-10-CM

## 2023-07-07 LAB — BASIC METABOLIC PANEL WITH GFR
BUN/Creatinine Ratio: 21 (ref 12–28)
BUN: 24 mg/dL (ref 8–27)
CO2: 20 mmol/L (ref 20–29)
Calcium: 10.3 mg/dL (ref 8.7–10.3)
Chloride: 103 mmol/L (ref 96–106)
Creatinine, Ser: 1.12 mg/dL — ABNORMAL HIGH (ref 0.57–1.00)
Glucose: 109 mg/dL — ABNORMAL HIGH (ref 70–99)
Potassium: 4.5 mmol/L (ref 3.5–5.2)
Sodium: 139 mmol/L (ref 134–144)
eGFR: 50 mL/min/{1.73_m2} — ABNORMAL LOW (ref >59)

## 2023-07-07 MED ORDER — CHLORTHALIDONE 25 MG PO TABS: 12.5000 mg | ORAL_TABLET | Freq: Every day | ORAL | 3 refills | Status: DC

## 2023-07-07 NOTE — Telephone Encounter (Signed)
 Spoke with pt to discuss lab results and recommendations. Pt will start chlorthalidone 12.5 mg daily, continue to monitor her blood pressure and repeat lab work in 7-10 days. Pt voiced understanding. Lab order mailed to pt per pts request!

## 2023-07-20 DIAGNOSIS — Z79899 Other long term (current) drug therapy: Secondary | ICD-10-CM | POA: Diagnosis not present

## 2023-07-20 DIAGNOSIS — I10 Essential (primary) hypertension: Secondary | ICD-10-CM | POA: Diagnosis not present

## 2023-07-21 LAB — BASIC METABOLIC PANEL WITH GFR
BUN/Creatinine Ratio: 21 (ref 12–28)
BUN: 30 mg/dL — ABNORMAL HIGH (ref 8–27)
CO2: 20 mmol/L (ref 20–29)
Calcium: 9.9 mg/dL (ref 8.7–10.3)
Chloride: 100 mmol/L (ref 96–106)
Creatinine, Ser: 1.46 mg/dL — ABNORMAL HIGH (ref 0.57–1.00)
Glucose: 149 mg/dL — ABNORMAL HIGH (ref 70–99)
Potassium: 3.7 mmol/L (ref 3.5–5.2)
Sodium: 136 mmol/L (ref 134–144)
eGFR: 37 mL/min/{1.73_m2} — ABNORMAL LOW (ref >59)

## 2023-07-24 ENCOUNTER — Telehealth: Payer: Self-pay

## 2023-07-24 DIAGNOSIS — Z79899 Other long term (current) drug therapy: Secondary | ICD-10-CM

## 2023-07-24 NOTE — Telephone Encounter (Signed)
-----   Message from Katlyn D West sent at 07/24/2023 10:15 AM EDT ----- Please let [name] know that her lab work shows she did have a slight decline in kidney function with starting of chlorthalidone , her electrolytes are overall normal. Recommend she stay hydrated with water and repeat a BMET next week, prior to appointment on 5/9. If kidney function remains reduced will need to make further blood pressure medication adjustements.

## 2023-07-24 NOTE — Telephone Encounter (Signed)
Unable to leave message voice mail full.

## 2023-07-25 DIAGNOSIS — C44319 Basal cell carcinoma of skin of other parts of face: Secondary | ICD-10-CM | POA: Diagnosis not present

## 2023-07-25 DIAGNOSIS — C44519 Basal cell carcinoma of skin of other part of trunk: Secondary | ICD-10-CM | POA: Diagnosis not present

## 2023-07-25 NOTE — Telephone Encounter (Signed)
 Voicemail full.

## 2023-07-27 NOTE — Telephone Encounter (Signed)
Follow Up:       Patient is calling back, concerning her lab results.

## 2023-07-27 NOTE — Telephone Encounter (Signed)
 Called and spoke to pt; Results and recommendations given. She plans to have repeat BMET drawn early the week of 07/31/2023, depending on when her daughter can take her to [facility].  BMET order entered and released.

## 2023-08-03 LAB — BASIC METABOLIC PANEL WITH GFR
BUN/Creatinine Ratio: 26 (ref 12–28)
BUN: 38 mg/dL — ABNORMAL HIGH (ref 8–27)
CO2: 21 mmol/L (ref 20–29)
Calcium: 9.8 mg/dL (ref 8.7–10.3)
Chloride: 104 mmol/L (ref 96–106)
Creatinine, Ser: 1.47 mg/dL — ABNORMAL HIGH (ref 0.57–1.00)
Glucose: 140 mg/dL — ABNORMAL HIGH (ref 70–99)
Potassium: 3.3 mmol/L — ABNORMAL LOW (ref 3.5–5.2)
Sodium: 141 mmol/L (ref 134–144)
eGFR: 36 mL/min/{1.73_m2} — ABNORMAL LOW (ref >59)

## 2023-08-03 NOTE — Progress Notes (Signed)
 Cardiology Office Note    Date:  08/04/2023  ID:  JACQUELLA QIAO, DOB 1945/10/14, MRN [id number] PCP:  Remo Carls, PA  Cardiologist:  Oneil Bigness, MD  Electrophysiologist:  None   Chief Complaint: Follow up for hypertension   History of Present Illness: [name]    [name] is a 78 y.o. female with visit-pertinent history of hypertension, hyperlipidemia, stroke, CKD stage IIIa and syncope.   Patient was first evaluated by Dr. Rolm Clos on 03/06/2019 for for syncope.  Patient reported episodes of syncope that she attributed to sudden drops in her blood pressure.  These episodes began around April and had continued intermittently since then.  Patient describes an episode where she passed out while taking a shower 6 months prior.  Patient reported that she had not had any further episodes since then as she sits down when she feels 1 coming on.  Patient notes that she would have dizziness and lightheadedness prior to starting of episodes, when she feels this coming on she sits down and symptoms resolved.   It was noted that patient was on losartan  100 mg and amlodipine  10 mg for hypertension, she noted that her blood pressure could be as high as 150/70 in the morning but dropped significantly if she is active during the day.  Echocardiogram and Lexiscan  nuclear medicine stress test was recommended.  Echocardiogram on 04/13/2023 indicated LVEF of 65%, no RWMA, mild LVH, grade 1 diastolic dysfunction, RV systolic function was normal, left atrial size was mildly dilated, mild mitral valve regurgitation was noted no evidence of mitral stenosis, aortic valve sclerosis was present without any evidence of stenosis.  MPI on 04/13/2023 was normal and low risk.   Patient was seen in clinic by Dr. Rolm Clos on 06/16/2023.  Patient's regimen included losartan  50 mg twice a day and amlodipine  5 mg daily.  She noted 1 episode of syncope a few weeks prior but no recent spells.  Patient's amlodipine  was increased to 10 mg  daily, she was continued on losartan  50 mg twice daily.  Patient presented on/10/25 reporting increased lower extremity edema and hypertension.  She reported that her blood pressure at home could fluctuate from 130 systolic up to 180 systolic.  She also reported the last week she had increased lower extremity edema that resolved after 1 dose of Lasix  40 mg.  Patient was started on hydrochlorothiazide.  Repeat lab work indicated increases in creatinine and low potassium.  Today she presents for follow-up.  She reports that she has been doing well.  She denies chest pain, shortness of breath, lower extremity edema, orthopnea or PND.  Patient reports that since starting on chlorthalidone  her blood pressure has been significantly better controlled, averaging mid 120s to 130s systolic over 60-70 diastolic.  She denies any increased dizziness, lightheadedness, presyncope or syncope.  Labwork independently reviewed: 08/02/2023: Sodium 141, potassium 3.3, creatinine 1.47 ROS: .   Today she denies chest pain, shortness of breath, lower extremity edema, fatigue, palpitations, melena, hematuria, hemoptysis, diaphoresis, weakness, presyncope, syncope, orthopnea, and PND.  All other systems are reviewed and otherwise negative. Studies Reviewed: [name]   EKG:  EKG is not ordered today.  CV Studies: Cardiac studies reviewed are outlined and summarized above. Otherwise please see EMR for full report. Cardiac Studies & Procedures   ______________________________________________________________________________________________   STRESS TESTS  MYOCARDIAL PERFUSION IMAGING 04/13/2023  Narrative   The study is normal. Findings are consistent with no ischemia and no infarction. The study is low  risk.   LV perfusion is normal.   Left ventricular function is normal. Nuclear stress EF: 72%. The left ventricular ejection fraction is hyperdynamic (>65%). End diastolic cavity size is normal.   2.0 mm of horizontal ST depression  (I, II and aVF) was noted.   Prior study available for comparison from 05/03/2005.   ECHOCARDIOGRAM  ECHOCARDIOGRAM COMPLETE 04/13/2023  Narrative ECHOCARDIOGRAM REPORT    Patient Name:   [name]    Date of Exam: 04/13/2023 Medical Rec #:  [id number]     Height:       63.0 in Accession #:    [id number]    Weight:       116.4 lb Date of Birth:  09-14-1945      BSA:          1.536 m Patient Age:    77 years      BP:           146/76 mmHg Patient Gender: F             HR:           60 bpm. Exam Location:  [address]  Procedure: 2D Echo, Cardiac Doppler, Color Doppler and Strain Analysis  Indications:    R55 Syncope  History:        Patient has prior history of Echocardiogram examinations, most recent 04/26/2005. Stroke; Risk Factors:Hypertension and Dyslipidemia.  Sonographer:    Brigid Canada RDCS Referring Phys: [id number] Cathay Clonts O'NEAL  IMPRESSIONS   1. Left ventricular ejection fraction, by estimation, is 65%. The left ventricle has normal function. The left ventricle has no regional wall motion abnormalities. There is mild left ventricular hypertrophy. Left ventricular diastolic parameters are consistent with Grade I diastolic dysfunction (impaired relaxation). The average left ventricular global longitudinal strain is -25.3 %. The global longitudinal strain is normal. 2. Right ventricular systolic function is normal. The right ventricular size is normal. Tricuspid regurgitation signal is inadequate for assessing PA pressure. 3. Left atrial size was mildly dilated. 4. The mitral valve is degenerative. Mild mitral valve regurgitation. No evidence of mitral stenosis. 5. The aortic valve is tricuspid. There is mild calcification of the aortic valve. There is mild thickening of the aortic valve. Aortic valve regurgitation is mild. Aortic valve sclerosis is present, with no evidence of aortic valve stenosis. 6. The inferior vena cava is normal in size with greater  than 50% respiratory variability, suggesting right atrial pressure of 3 mmHg.  FINDINGS Left Ventricle: Left ventricular ejection fraction, by estimation, is 65%. The left ventricle has normal function. The left ventricle has no regional wall motion abnormalities. The average left ventricular global longitudinal strain is -25.3 %. The global longitudinal strain is normal. The left ventricular internal cavity size was normal in size. There is mild left ventricular hypertrophy. Left ventricular diastolic parameters are consistent with Grade I diastolic dysfunction (impaired relaxation).  Right Ventricle: The right ventricular size is normal. No increase in right ventricular wall thickness. Right ventricular systolic function is normal. Tricuspid regurgitation signal is inadequate for assessing PA pressure.  Left Atrium: Left atrial size was mildly dilated.  Right Atrium: Right atrial size was normal in size.  Pericardium: There is no evidence of pericardial effusion. Presence of epicardial fat layer.  Mitral Valve: The mitral valve is degenerative in appearance. Mild mitral annular calcification. Mild mitral valve regurgitation. No evidence of mitral valve stenosis.  Tricuspid Valve: The tricuspid valve is normal in structure. Tricuspid valve regurgitation is not  demonstrated. No evidence of tricuspid stenosis.  The aortic valve is tricuspid. There is mild calcification of the aortic valve. There is mild thickening of the aortic valve. Aortic valve regurgitation is mild. Aortic valve sclerosis is present, with no evidence of aortic valve stenosis. Aortic valve mean gradient measures 3.7 mmHg. Aortic valve peak gradient measures 7.3 mmHg. Aortic valve area, by VTI measures 2.08 cm.  Pulmonic Valve: The pulmonic valve was normal in structure. Pulmonic valve regurgitation is trivial. No evidence of pulmonic stenosis.  Aorta: The aortic root is normal in size and structure. Ascending aorta  measurements are within normal limits for age when indexed to body surface area.  Venous: The inferior vena cava is normal in size with greater than 50% respiratory variability, suggesting right atrial pressure of 3 mmHg.  IAS/Shunts: The atrial septum is grossly normal.   LEFT VENTRICLE PLAX 2D LVIDd:         4.30 cm   Diastology LVIDs:         3.00 cm   LV e' medial:    4.62 cm/s LV PW:         1.10 cm   LV E/e' medial:  13.8 LV IVS:        1.20 cm   LV e' lateral:   4.78 cm/s LVOT diam:     1.90 cm   LV E/e' lateral: 13.4 LV SV:         68 LV SV Index:   45        2D Longitudinal Strain LVOT Area:     2.84 cm  2D Strain GLS Avg:     -25.3 %   RIGHT VENTRICLE             IVC RV Basal diam:  3.30 cm     IVC diam: 1.30 cm RV S prime:     11.25 cm/s TAPSE (M-mode): 2.3 cm  LEFT ATRIUM             Index        RIGHT ATRIUM          Index LA diam:        4.40 cm 2.86 cm/m   RA Area:     7.84 cm LA Vol (A2C):   47.2 ml 30.72 ml/m  RA Volume:   15.00 ml 9.76 ml/m LA Vol (A4C):   57.6 ml 37.49 ml/m LA Biplane Vol: 52.4 ml 34.11 ml/m AORTIC VALVE AV Area (Vmax):    2.03 cm AV Area (Vmean):   1.99 cm AV Area (VTI):     2.08 cm AV Vmax:           134.75 cm/s AV Vmean:          91.538 cm/s AV VTI:            0.329 m AV Peak Grad:      7.3 mmHg AV Mean Grad:      3.7 mmHg LVOT Vmax:         96.30 cm/s LVOT Vmean:        64.100 cm/s LVOT VTI:          0.242 m LVOT/AV VTI ratio: 0.73 AI PHT:            556 msec  AORTA Ao Root diam: 3.10 cm Ao Asc diam:  3.80 cm  MITRAL VALVE MV Area (PHT): 1.99 cm    SHUNTS MV Decel Time: 381 msec    Systemic VTI:  0.24 m MV E velocity: 63.95 cm/s  Systemic Diam: 1.90 cm MV A velocity: 90.65 cm/s MV E/A ratio:  0.71  Grady Lawman MD Electronically signed by Grady Lawman MD Signature Date/Time: 04/13/2023/12:43:57 PM    Final           ______________________________________________________________________________________________       Current Reported Medications:.    Current Meds  Medication Sig   acetaminophen  (TYLENOL ) 500 MG tablet Take 1 tablet (500 mg total) by mouth every 6 (six) hours as needed. Do not take if you have had an oxycodone /acetaminophen  tablet within 4 hours.   amLODipine  (NORVASC ) 10 MG tablet Take 1 tablet (10 mg total) by mouth daily.   carbamazepine  (TEGRETOL ) 200 MG tablet Take 200 mg by mouth daily.   citalopram  (CELEXA ) 40 MG tablet Take 40 mg by mouth in the morning and at bedtime.   citalopram  (CELEXA ) 40 MG tablet Take 40 mg by mouth daily.   cyclobenzaprine (FLEXERIL) 5 MG tablet Take 5 mg by mouth at bedtime as needed.   furosemide  (LASIX ) 40 MG tablet Take 1 tablet (40 mg total) by mouth as directed. Take 1 tablet (40 mg total) 1-2 times per week as needed for swelling.   omeprazole (PRILOSEC) 40 MG capsule Take 40 mg by mouth daily.   potassium chloride  (KLOR-CON  M) 10 MEQ tablet Take 10 mEq by mouth daily.   [DISCONTINUED] chlorthalidone  (HYGROTON ) 25 MG tablet Take 0.5 tablets (12.5 mg total) by mouth daily.   [DISCONTINUED] losartan  (COZAAR ) 50 MG tablet Take 1 tablet (50 mg total) by mouth daily.    Physical Exam:    VS:  BP (!) 148/70   Pulse 68   Ht 5\' 2"  (1.575 m)   Wt 119 lb (54 kg)   SpO2 95%   BMI 21.77 kg/m    Wt Readings from Last 3 Encounters:  08/04/23 119 lb (54 kg)  07/06/23 121 lb 3.2 oz (55 kg)  06/16/23 120 lb 6.4 oz (54.6 kg)    GEN: Well nourished, well developed in no acute distress NECK: No JVD; No carotid bruits CARDIAC: RRR, no murmurs, rubs, gallops RESPIRATORY:  Clear to auscultation without rales, wheezing or rhonchi  ABDOMEN: Soft, non-tender, non-distended EXTREMITIES:  No edema; No acute deformity     Asessement and Plan:[name]    Hypertension/hypokalemia: Her blood pressure today 160/72, on recheck was 148/70.  Patient reports that since  starting on chlorthalidone  her blood pressure has improved averaging 125-130/60-70.  She reports that she has been tolerating chlorthalidone  well and has had no further lower extremity edema.  Unfortunately patient's lab work indicates elevations in creatinine and hypokalemia, reviewed with Pharm.D. and had patient recheck BMET without improvement, x-rays of it is recommended that patient discontinue use of chlorthalidone .  Last creatinine 1.47 and potassium level 3.3.  Will have patient take potassium chloride  40 mEq for 3 days and discontinue use of chlorthalidone .  Recheck BMET in one week. On further discussion with patient she notes that she has not been taking losartan  50 mg twice daily, only once daily.  Will have patient start losartan  50 mg twice daily.  Encouraged patient to monitor her blood pressure at home and notify the office if consistently elevated above 140/90.  Continue amlodipine  10 mg daily, losartan .  Lower extremity edema/lower extremity cramping: Today patient reports that her lower extremity edema has resolved.  She has noted some increased lower extremity cramping since starting on chlorthalidone , likely related to hypokalemia as noted below.  Will  discontinue chlorthalidone .  Syncope: Patient with an episode of syncope in summer 2024 while showering, felt to likely be due to orthostatic hypotension.  Patient denied any further episodes as she sits down when feeling lightheaded. Today she reports that her blood pressure has been better controlled.  She denies any further presyncope or syncope. Reviewed ED precautions.   Disposition: F/u with Fernanda Twaddell, NP in 5-6 weeks.   Signed, Myasia Sinatra D Tegh Franek, NP

## 2023-08-04 ENCOUNTER — Encounter: Payer: Self-pay | Admitting: Cardiology

## 2023-08-04 ENCOUNTER — Ambulatory Visit: Attending: Cardiology | Admitting: Cardiology

## 2023-08-04 VITALS — BP 148/70 | HR 68 | Ht 62.0 in | Wt 119.0 lb

## 2023-08-04 DIAGNOSIS — Z79899 Other long term (current) drug therapy: Secondary | ICD-10-CM | POA: Diagnosis not present

## 2023-08-04 DIAGNOSIS — E876 Hypokalemia: Secondary | ICD-10-CM

## 2023-08-04 DIAGNOSIS — R600 Localized edema: Secondary | ICD-10-CM

## 2023-08-04 DIAGNOSIS — I10 Essential (primary) hypertension: Secondary | ICD-10-CM

## 2023-08-04 DIAGNOSIS — R55 Syncope and collapse: Secondary | ICD-10-CM | POA: Diagnosis not present

## 2023-08-04 MED ORDER — LOSARTAN POTASSIUM 50 MG PO TABS: 50.0000 mg | ORAL_TABLET | Freq: Two times a day (BID) | ORAL | Status: DC

## 2023-08-04 NOTE — Patient Instructions (Signed)
 Medication Instructions:  STOP CHLORTHALIDONE   START LOSARTAN  50 MG TWICE DAILY   DOUBLE POTASSIUM SUPPLEMENT FOR 3 DAYS THEN BACK TO NORMAL DOSE *If you need a refill on your cardiac medications before your next appointment, please call your pharmacy*  Lab Work: BMET NEXT WEEK  If you have labs (blood work) drawn today and your tests are completely normal, you will receive your results only by: MyChart Message (if you have MyChart) OR A paper copy in the mail If you have any lab test that is abnormal or we need to change your treatment, we will call you to review the results.  Testing/Procedures: NO TESTING  Follow-Up: At [facility], you and your health needs are our priority.  As part of our continuing mission to provide you with exceptional heart care, our providers are all part of one team.  This team includes your primary Cardiologist (physician) and Advanced Practice Providers or APPs (Physician Assistants and Nurse Practitioners) who all work together to provide you with the care you need, when you need it.  Your next appointment:   5-6 week(s)  Provider:   Katlyn West, NP  We recommend signing up for the patient portal called "MyChart".  Sign up information is provided on this After Visit Summary.  MyChart is used to connect with patients for Virtual Visits (Telemedicine).  Patients are able to view lab/test results, encounter notes, upcoming appointments, etc.  Non-urgent messages can be sent to your provider as well.   To learn more about what you can do with MyChart, go to [email or web address].   Other Instructions CONTINUE TO MONITOR BLOOD PRESSURE. CHECK DAILY AND KEEP RECORD ON BLOOD PRESSURE LOG, BRING LOG TO NEXT OFFICE VISIT.

## 2023-08-11 ENCOUNTER — Ambulatory Visit: Payer: Self-pay | Admitting: Emergency Medicine

## 2023-08-11 LAB — BASIC METABOLIC PANEL WITH GFR
BUN/Creatinine Ratio: 18 (ref 12–28)
BUN: 19 mg/dL (ref 8–27)
CO2: 19 mmol/L — ABNORMAL LOW (ref 20–29)
Calcium: 10.1 mg/dL (ref 8.7–10.3)
Chloride: 106 mmol/L (ref 96–106)
Creatinine, Ser: 1.05 mg/dL — ABNORMAL HIGH (ref 0.57–1.00)
Glucose: 145 mg/dL — ABNORMAL HIGH (ref 70–99)
Potassium: 4.1 mmol/L (ref 3.5–5.2)
Sodium: 141 mmol/L (ref 134–144)
eGFR: 54 mL/min/{1.73_m2} — ABNORMAL LOW (ref >59)

## 2023-09-11 ENCOUNTER — Ambulatory Visit: Admitting: Emergency Medicine

## 2023-09-15 ENCOUNTER — Ambulatory Visit: Admitting: Cardiology

## 2024-01-18 DIAGNOSIS — I10 Essential (primary) hypertension: Secondary | ICD-10-CM | POA: Diagnosis not present

## 2024-01-18 DIAGNOSIS — E7800 Pure hypercholesterolemia, unspecified: Secondary | ICD-10-CM | POA: Diagnosis not present

## 2024-01-18 DIAGNOSIS — E559 Vitamin D deficiency, unspecified: Secondary | ICD-10-CM | POA: Diagnosis not present

## 2024-01-18 DIAGNOSIS — F5101 Primary insomnia: Secondary | ICD-10-CM | POA: Diagnosis not present

## 2024-01-18 DIAGNOSIS — G459 Transient cerebral ischemic attack, unspecified: Secondary | ICD-10-CM | POA: Diagnosis not present

## 2024-01-18 DIAGNOSIS — N1832 Chronic kidney disease, stage 3b: Secondary | ICD-10-CM | POA: Diagnosis not present

## 2024-01-18 DIAGNOSIS — Z23 Encounter for immunization: Secondary | ICD-10-CM | POA: Diagnosis not present

## 2024-01-18 DIAGNOSIS — I700 Atherosclerosis of aorta: Secondary | ICD-10-CM | POA: Diagnosis not present

## 2024-01-18 DIAGNOSIS — Z0000 Encounter for general adult medical examination without abnormal findings: Secondary | ICD-10-CM | POA: Diagnosis not present

## 2024-01-18 DIAGNOSIS — I672 Cerebral atherosclerosis: Secondary | ICD-10-CM | POA: Diagnosis not present

## 2024-01-18 DIAGNOSIS — K219 Gastro-esophageal reflux disease without esophagitis: Secondary | ICD-10-CM | POA: Diagnosis not present

## 2024-01-18 DIAGNOSIS — C44310 Basal cell carcinoma of skin of unspecified parts of face: Secondary | ICD-10-CM | POA: Diagnosis not present

## 2024-02-26 ENCOUNTER — Other Ambulatory Visit: Payer: Self-pay | Admitting: Cardiovascular Disease

## 2024-02-29 ENCOUNTER — Other Ambulatory Visit: Payer: Self-pay | Admitting: Cardiology
